# Patient Record
Sex: Male | Born: 1965 | Race: Black or African American | Hispanic: No | Marital: Married | Smoking: Never smoker
Health system: Southern US, Community
[De-identification: ages and names within clinical notes are randomized; demographics above are authoritative.]

## PROBLEM LIST (undated history)

## (undated) DIAGNOSIS — I10 Essential (primary) hypertension: Secondary | ICD-10-CM

## (undated) DIAGNOSIS — C61 Malignant neoplasm of prostate: Secondary | ICD-10-CM

## (undated) HISTORY — PX: OTHER SURGICAL HISTORY: SHX169

---

## 2004-05-09 ENCOUNTER — Emergency Department (HOSPITAL_COMMUNITY): Admission: EM | Admit: 2004-05-09 | Discharge: 2004-05-09 | Payer: Self-pay | Admitting: Family Medicine

## 2009-02-25 ENCOUNTER — Emergency Department (HOSPITAL_COMMUNITY): Admission: EM | Admit: 2009-02-25 | Discharge: 2009-02-25 | Payer: Self-pay | Admitting: Emergency Medicine

## 2014-01-02 ENCOUNTER — Emergency Department (HOSPITAL_BASED_OUTPATIENT_CLINIC_OR_DEPARTMENT_OTHER): Payer: Self-pay

## 2014-01-02 ENCOUNTER — Emergency Department (HOSPITAL_BASED_OUTPATIENT_CLINIC_OR_DEPARTMENT_OTHER)
Admission: EM | Admit: 2014-01-02 | Discharge: 2014-01-02 | Disposition: A | Discharge status: Home / Self Care | Payer: Self-pay | Attending: Emergency Medicine | Admitting: Emergency Medicine

## 2014-01-02 ENCOUNTER — Encounter (HOSPITAL_BASED_OUTPATIENT_CLINIC_OR_DEPARTMENT_OTHER): Payer: Self-pay | Admitting: Emergency Medicine

## 2014-01-02 DIAGNOSIS — Z792 Long term (current) use of antibiotics: Secondary | ICD-10-CM | POA: Insufficient documentation

## 2014-01-02 DIAGNOSIS — J209 Acute bronchitis, unspecified: Secondary | ICD-10-CM | POA: Insufficient documentation

## 2014-01-02 DIAGNOSIS — R059 Cough, unspecified: Secondary | ICD-10-CM

## 2014-01-02 DIAGNOSIS — M791 Myalgia: Secondary | ICD-10-CM | POA: Insufficient documentation

## 2014-01-02 DIAGNOSIS — J40 Bronchitis, not specified as acute or chronic: Secondary | ICD-10-CM

## 2014-01-02 DIAGNOSIS — R05 Cough: Secondary | ICD-10-CM

## 2014-01-02 MED ORDER — AZITHROMYCIN 250 MG PO TABS: 250.0000 mg | ORAL_TABLET | Freq: Every day | ORAL | Status: DC

## 2014-01-02 MED ORDER — ALBUTEROL SULFATE HFA 108 (90 BASE) MCG/ACT IN AERS
1.0000 | INHALATION_SPRAY | RESPIRATORY_TRACT | Status: DC | PRN
  Administered 2014-01-02: 2 via RESPIRATORY_TRACT
  Filled 2014-01-02: qty 6.7

## 2014-01-02 NOTE — ED Notes (Signed)
States he has had a productive cough since Oct 2014

## 2014-01-02 NOTE — ED Notes (Signed)
Pt presents to ED with complaints of fever and flu like symptoms for the past week.

## 2014-01-02 NOTE — Discharge Instructions (Signed)
Upper Respiratory Infection, Adult An upper respiratory infection (URI) is also sometimes known as the common cold. The upper respiratory tract includes the nose, sinuses, throat, trachea, and bronchi. Bronchi are the airways leading to the lungs. Most people improve within 1 week, but symptoms can last up to 2 weeks. A residual cough may last even longer.  CAUSES Many different viruses can infect the tissues lining the upper respiratory tract. The tissues become irritated and inflamed and often become very moist. Mucus production is also common. A cold is contagious. You can easily spread the virus to others by oral contact. This includes kissing, sharing a glass, coughing, or sneezing. Touching your mouth or nose and then touching a surface, which is then touched by another person, can also spread the virus. SYMPTOMS  Symptoms typically develop 1 to 3 days after you come in contact with a cold virus. Symptoms vary from person to person. They may include:  Runny nose.  Sneezing.  Nasal congestion.  Sinus irritation.  Sore throat.  Loss of voice (laryngitis).  Cough.  Fatigue.  Muscle aches.  Loss of appetite.  Headache.  Low-grade fever. DIAGNOSIS  You might diagnose your own cold based on familiar symptoms, since most people get a cold 2 to 3 times a year. Your caregiver can confirm this based on your exam. Most importantly, your caregiver can check that your symptoms are not due to another disease such as strep throat, sinusitis, pneumonia, asthma, or epiglottitis. Blood tests, throat tests, and X-rays are not necessary to diagnose a common cold, but they may sometimes be helpful in excluding other more serious diseases. Your caregiver will decide if any further tests are required. RISKS AND COMPLICATIONS  You may be at risk for a more severe case of the common cold if you smoke cigarettes, have chronic heart disease (such as heart failure) or lung disease (such as asthma), or if  you have a weakened immune system. The very young and very old are also at risk for more serious infections. Bacterial sinusitis, middle ear infections, and bacterial pneumonia can complicate the common cold. The common cold can worsen asthma and chronic obstructive pulmonary disease (COPD). Sometimes, these complications can require emergency medical care and may be life-threatening. PREVENTION  The best way to protect against getting a cold is to practice good hygiene. Avoid oral or hand contact with people with cold symptoms. Wash your hands often if contact occurs. There is no clear evidence that vitamin C, vitamin E, echinacea, or exercise reduces the chance of developing a cold. However, it is always recommended to get plenty of rest and practice good nutrition. TREATMENT  Treatment is directed at relieving symptoms. There is no cure. Antibiotics are not effective, because the infection is caused by a virus, not by bacteria. Treatment may include:  Increased fluid intake. Sports drinks offer valuable electrolytes, sugars, and fluids.  Breathing heated mist or steam (vaporizer or shower).  Eating chicken soup or other clear broths, and maintaining good nutrition.  Getting plenty of rest.  Using gargles or lozenges for comfort.  Controlling fevers with ibuprofen or acetaminophen as directed by your caregiver.  Increasing usage of your inhaler if you have asthma. Zinc gel and zinc lozenges, taken in the first 24 hours of the common cold, can shorten the duration and lessen the severity of symptoms. Pain medicines may help with fever, muscle aches, and throat pain. A variety of non-prescription medicines are available to treat congestion and runny nose. Your caregiver   can make recommendations and may suggest nasal or lung inhalers for other symptoms.  HOME CARE INSTRUCTIONS   Only take over-the-counter or prescription medicines for pain, discomfort, or fever as directed by your  caregiver.  Use a warm mist humidifier or inhale steam from a shower to increase air moisture. This may keep secretions moist and make it easier to breathe.  Drink enough water and fluids to keep your urine clear or pale yellow.  Rest as needed.  Return to work when your temperature has returned to normal or as your caregiver advises. You may need to stay home longer to avoid infecting others. You can also use a face mask and careful hand washing to prevent spread of the virus. SEEK MEDICAL CARE IF:   After the first few days, you feel you are getting worse rather than better.  You need your caregiver's advice about medicines to control symptoms.  You develop chills, worsening shortness of breath, or brown or red sputum. These may be signs of pneumonia.  You develop yellow or brown nasal discharge or pain in the face, especially when you bend forward. These may be signs of sinusitis.  You develop a fever, swollen neck glands, pain with swallowing, or white areas in the back of your throat. These may be signs of strep throat. SEEK IMMEDIATE MEDICAL CARE IF:   You have a fever.  You develop severe or persistent headache, ear pain, sinus pain, or chest pain.  You develop wheezing, a prolonged cough, cough up blood, or have a change in your usual mucus (if you have chronic lung disease).  You develop sore muscles or a stiff neck. Document Released: 07/18/2000 Document Revised: 04/16/2011 Document Reviewed: 04/29/2013 Exit[employer] Patient Information 2015 [employer]. This information is not intended to replace advice given to you by your health care provider. Make sure you discuss any questions you have with your health care provider.  

## 2014-01-02 NOTE — ED Provider Notes (Signed)
CSN: [id number]     Arrival date & time 01/02/14  1056 History   First MD Initiated Contact with Patient 01/02/14 1241     Chief Complaint  Patient presents with  . Fever     (Consider location/radiation/quality/duration/timing/severity/associated sxs/prior Treatment) HPI Comments: PT presents with cough and chest congestion.  He had symptoms about 2 weeks ago and go a little better, then started getting worse again about 3 days ago. He's continuing to have fevers with productive cough. He's having a little bit of wheezing but no shortness of breath. He's having myalgias. He has more chest congestion but not too much nasal congestion. He denies any nausea vomiting or diarrhea. He's a nonsmoker. He has a childhood history of asthma but hasn't had problems in many years. He's been using over-the-counter medicines without relief.  Patient is a 48 y.o. male presenting with fever.  Fever Associated symptoms: congestion, cough, myalgias and rhinorrhea   Associated symptoms: no chest pain, no chills, no diarrhea, no headaches, no nausea, no rash and no vomiting     History reviewed. No pertinent past medical history. History reviewed. No pertinent past surgical history. No family history on file. History  Substance Use Topics  . Smoking status: Never Smoker   . Smokeless tobacco: Not on file  . Alcohol Use: Not on file    Review of Systems  Constitutional: Positive for fever and fatigue. Negative for chills and diaphoresis.  HENT: Positive for congestion and rhinorrhea. Negative for sneezing.   Eyes: Negative.   Respiratory: Positive for cough and wheezing. Negative for chest tightness and shortness of breath.   Cardiovascular: Negative for chest pain and leg swelling.  Gastrointestinal: Negative for nausea, vomiting, abdominal pain, diarrhea and blood in stool.  Genitourinary: Negative for frequency, hematuria, flank pain and difficulty urinating.  Musculoskeletal: Positive for  myalgias. Negative for back pain and arthralgias.  Skin: Negative for rash.  Neurological: Negative for dizziness, speech difficulty, weakness, numbness and headaches.      Allergies  Review of patient's allergies indicates no known allergies.  Home Medications   Prior to Admission medications   Medication Sig Start Date End Date Taking? Authorizing Provider  azithromycin (ZITHROMAX) 250 MG tablet Take 1 tablet (250 mg total) by mouth daily. Take first 2 tablets together, then 1 every day until finished. 01/02/14   Malvin Johns, MD   BP 153/89 mmHg  Pulse 101  Temp(Src) 99.1 F (37.3 C) (Oral)  Resp 20  Ht 6\' 1"  (1.854 m)  Wt 245 lb (111.131 kg)  BMI 32.33 kg/m2  SpO2 100% Physical Exam  Constitutional: He is oriented to person, place, and time. He appears well-developed and well-nourished.  HENT:  Head: Normocephalic and atraumatic.  Right Ear: External ear normal.  Left Ear: External ear normal.  Mouth/Throat: Oropharynx is clear and moist.  Eyes: Pupils are equal, round, and reactive to light.  Neck: Normal range of motion. Neck supple.  Cardiovascular: Normal rate, regular rhythm and normal heart sounds.   Pulmonary/Chest: Effort normal. No respiratory distress. He has wheezes (mild expiratory wheezes bilaterally). He has no rales. He exhibits no tenderness.  Abdominal: Soft. Bowel sounds are normal. There is no tenderness. There is no rebound and no guarding.  Musculoskeletal: Normal range of motion. He exhibits no edema.  Lymphadenopathy:    He has no cervical adenopathy.  Neurological: He is alert and oriented to person, place, and time.  Skin: Skin is warm and dry. No rash noted.  Psychiatric: He  has a normal mood and affect.    ED Course  Procedures (including critical care time) Labs Review Labs Reviewed - No data to display  Imaging Review Dg Chest 2 View  01/02/2014   CLINICAL DATA:  Initial evaluation for congestion cough and fever for 1 week  EXAM:  CHEST  2 VIEW  COMPARISON:  None.  FINDINGS: The heart size and mediastinal contours are within normal limits. Both lungs are clear. The visualized skeletal structures are unremarkable.  IMPRESSION: No active cardiopulmonary disease.   Electronically Signed   By: Skipper Cliche M.D.   On: 01/02/2014 12:33     EKG Interpretation None      MDM   Final diagnoses:  Cough  Bronchitis    Given patient's ongoing symptoms, I will go ahead and start him on antibiotics as well as an albuterol inhaler. I encouraged him to follow-up with primary care physician or return here as needed for any worsening symptoms.    Malvin Johns, MD 01/02/14 1310

## 2015-04-25 ENCOUNTER — Encounter (HOSPITAL_BASED_OUTPATIENT_CLINIC_OR_DEPARTMENT_OTHER): Payer: Self-pay | Admitting: Emergency Medicine

## 2015-04-25 DIAGNOSIS — R05 Cough: Secondary | ICD-10-CM | POA: Diagnosis present

## 2015-04-25 DIAGNOSIS — J111 Influenza due to unidentified influenza virus with other respiratory manifestations: Secondary | ICD-10-CM | POA: Diagnosis not present

## 2015-04-25 NOTE — ED Notes (Signed)
Patient states that on saturday he started to have "flu like" symptoms. Reports that he is having a cough with lots of mucose and fever.  Patient reports generalized body aches and reports chest pain with a cough.

## 2015-04-26 ENCOUNTER — Emergency Department (HOSPITAL_BASED_OUTPATIENT_CLINIC_OR_DEPARTMENT_OTHER): Payer: BLUE CROSS/BLUE SHIELD

## 2015-04-26 ENCOUNTER — Emergency Department (HOSPITAL_BASED_OUTPATIENT_CLINIC_OR_DEPARTMENT_OTHER)
Admission: EM | Admit: 2015-04-26 | Discharge: 2015-04-26 | Disposition: A | Discharge status: Home / Self Care | Payer: BLUE CROSS/BLUE SHIELD | Attending: Emergency Medicine | Admitting: Emergency Medicine

## 2015-04-26 DIAGNOSIS — J111 Influenza due to unidentified influenza virus with other respiratory manifestations: Secondary | ICD-10-CM

## 2015-04-26 MED ORDER — ALBUTEROL SULFATE HFA 108 (90 BASE) MCG/ACT IN AERS
2.0000 | INHALATION_SPRAY | RESPIRATORY_TRACT | Status: DC | PRN
  Administered 2015-04-26: 2 via RESPIRATORY_TRACT
  Filled 2015-04-26: qty 6.7

## 2015-04-26 MED ORDER — HYDROCOD POLST-CPM POLST ER 10-8 MG/5ML PO SUER
5.0000 mL | Freq: Once | ORAL | Status: AC
  Administered 2015-04-26: 5 mL via ORAL
  Filled 2015-04-26: qty 5

## 2015-04-26 MED ORDER — DOXYCYCLINE HYCLATE 100 MG PO TABS
100.0000 mg | ORAL_TABLET | Freq: Once | ORAL | Status: AC
  Administered 2015-04-26: 100 mg via ORAL
  Filled 2015-04-26: qty 1

## 2015-04-26 MED ORDER — HYDROCOD POLST-CPM POLST ER 10-8 MG/5ML PO SUER: 5.0000 mL | Freq: Two times a day (BID) | ORAL | Status: DC | PRN

## 2015-04-26 MED ORDER — DOXYCYCLINE HYCLATE 100 MG PO CAPS: 100.0000 mg | ORAL_CAPSULE | Freq: Two times a day (BID) | ORAL | Status: DC

## 2015-04-26 NOTE — Discharge Instructions (Signed)

## 2015-04-26 NOTE — ED Provider Notes (Addendum)
CSN: [id number]     Arrival date & time 04/25/15  2350 History   First MD Initiated Contact with Patient 04/26/15 0131     Chief Complaint  Patient presents with  . Cough     (Consider location/radiation/quality/duration/timing/severity/associated sxs/prior Treatment) HPI  This is a 50 year old male with a three-day history of nasal congestion, fever, body aches, cough productive of green sputum, sore throat, chest soreness and malaise. He has taken DayQuil and Mucinex without adequate relief. He denies nausea, vomiting or diarrhea. Symptoms are moderate to severe.  History reviewed. No pertinent past medical history. History reviewed. No pertinent past surgical history. History reviewed. No pertinent family history. Social History  Substance Use Topics  . Smoking status: Never Smoker   . Smokeless tobacco: None  . Alcohol Use: No    Review of Systems  All other systems reviewed and are negative.   Allergies  Review of patient's allergies indicates no known allergies.  Home Medications   Prior to Admission medications   Medication Sig Start Date End Date Taking? Authorizing Provider  azithromycin (ZITHROMAX) 250 MG tablet Take 1 tablet (250 mg total) by mouth daily. Take first 2 tablets together, then 1 every day until finished. 01/02/14   Malvin Johns, MD   BP 171/93 mmHg  Pulse 104  Temp(Src) 99.1 F (37.3 C) (Oral)  Resp 20  Ht 6\' 1"  (1.854 m)  Wt 256 lb (116.121 kg)  BMI 33.78 kg/m2  SpO2 100%   Physical Exam  General: Well-developed, well-nourished male in no acute distress; appearance consistent with age of record HENT: normocephalic; atraumatic; nasal congestion; pharynx normal Eyes: pupils equal, round and reactive to light; extraocular muscles intact Neck: supple Heart: regular rate and rhythm Lungs: Decreased air movement bilaterally; frequent cough Abdomen: soft; nondistended; nontender; no masses or hepatosplenomegaly; bowel sounds  present Extremities: No deformity; full range of motion Neurologic: Awake, alert and oriented; motor function intact in all extremities and symmetric; no facial droop Skin: Warm and dry Psychiatric: Normal mood and affect    ED Course  Procedures (including critical care time)   MDM  Nursing notes and vitals signs, including pulse oximetry, reviewed.  Summary of this visit's results, reviewed by myself:  Imaging Studies: Dg Chest 2 View  04/26/2015  CLINICAL DATA:  Cough and fever for 3 days.  Previous smoker. EXAM: CHEST  2 VIEW COMPARISON:  01/02/2014 FINDINGS: Shallow inspiration with elevation of the left hemidiaphragm. Heart size and pulmonary vascularity are normal. Patchy infiltration demonstrated in the lung bases possibly representing pneumonia. No blunting of costophrenic angles. No pneumothorax. Mediastinal contours appear intact. Degenerative changes in the spine. IMPRESSION: Shallow inspiration. Patchy infiltrates in the lung bases may indicate pneumonia. Electronically Signed   By: Lucienne Capers M.D.   On: 04/26/2015 01:40   We'll treat for influenza as well as possible superimposed pneumonia.    Shanon Rosser, MD 04/26/15 [id number]  Shanon Rosser, MD 04/26/15 [id number]

## 2015-12-04 DIAGNOSIS — G8929 Other chronic pain: Secondary | ICD-10-CM | POA: Insufficient documentation

## 2015-12-04 DIAGNOSIS — R519 Headache, unspecified: Secondary | ICD-10-CM | POA: Insufficient documentation

## 2018-07-17 DIAGNOSIS — R972 Elevated prostate specific antigen [PSA]: Secondary | ICD-10-CM | POA: Insufficient documentation

## 2018-07-24 DIAGNOSIS — N5201 Erectile dysfunction due to arterial insufficiency: Secondary | ICD-10-CM | POA: Insufficient documentation

## 2019-05-18 ENCOUNTER — Other Ambulatory Visit: Payer: Self-pay

## 2019-05-19 ENCOUNTER — Ambulatory Visit (INDEPENDENT_AMBULATORY_CARE_PROVIDER_SITE_OTHER): Payer: 59 | Admitting: Medical

## 2019-05-19 ENCOUNTER — Telehealth: Payer: Self-pay | Admitting: Medical

## 2019-05-19 ENCOUNTER — Ambulatory Visit (HOSPITAL_BASED_OUTPATIENT_CLINIC_OR_DEPARTMENT_OTHER)
Admission: RE | Admit: 2019-05-19 | Discharge: 2019-05-19 | Disposition: A | Discharge status: Home / Self Care | Payer: 59 | Source: Ambulatory Visit | Attending: Medical | Admitting: Medical

## 2019-05-19 ENCOUNTER — Telehealth: Payer: Self-pay

## 2019-05-19 ENCOUNTER — Encounter: Payer: Self-pay | Admitting: Medical

## 2019-05-19 VITALS — BP 130/70 | HR 80 | Temp 97.0°F | Resp 18 | Ht 72.0 in | Wt 269.0 lb

## 2019-05-19 DIAGNOSIS — R350 Frequency of micturition: Secondary | ICD-10-CM | POA: Diagnosis not present

## 2019-05-19 DIAGNOSIS — M25562 Pain in left knee: Secondary | ICD-10-CM

## 2019-05-19 DIAGNOSIS — M2550 Pain in unspecified joint: Secondary | ICD-10-CM | POA: Diagnosis not present

## 2019-05-19 DIAGNOSIS — I10 Essential (primary) hypertension: Secondary | ICD-10-CM | POA: Diagnosis not present

## 2019-05-19 DIAGNOSIS — M79609 Pain in unspecified limb: Secondary | ICD-10-CM | POA: Diagnosis not present

## 2019-05-19 DIAGNOSIS — R972 Elevated prostate specific antigen [PSA]: Secondary | ICD-10-CM

## 2019-05-19 LAB — POC URINALSYSI DIPSTICK (AUTOMATED)
Bilirubin, UA: NEGATIVE
Blood, UA: 1
Glucose, UA: NEGATIVE
Ketones, UA: NEGATIVE
Leukocytes, UA: NEGATIVE
Nitrite, UA: NEGATIVE
Protein, UA: POSITIVE — AB
Spec Grav, UA: 1.02 (ref 1.010–1.025)
Urobilinogen, UA: 0.2 E.U./dL
pH, UA: 6.5 (ref 5.0–8.0)

## 2019-05-19 LAB — SEDIMENTATION RATE: Sed Rate: 46 mm/hr — ABNORMAL HIGH (ref 0–20)

## 2019-05-19 LAB — C-REACTIVE PROTEIN: CRP: <1 mg/dL (ref 0.5–20.0)

## 2019-05-19 LAB — URIC ACID: Uric Acid, Serum: 7 mg/dL (ref 4.0–7.8)

## 2019-05-19 LAB — PSA: PSA: 13.48 ng/mL — ABNORMAL HIGH (ref 0.10–4.00)

## 2019-05-19 MED ORDER — HYDROCHLOROTHIAZIDE 25 MG PO TABS: 25.0000 mg | ORAL_TABLET | Freq: Every day | ORAL | 11 refills | Status: DC

## 2019-05-19 MED ORDER — AMLODIPINE BESYLATE 10 MG PO TABS: 10.0000 mg | ORAL_TABLET | Freq: Every day | ORAL | 11 refills | Status: DC

## 2019-05-19 NOTE — Telephone Encounter (Signed)
Rx sent to pt pharmacy 

## 2019-05-19 NOTE — Progress Notes (Signed)
Subjective:    Patient ID: Malik Daniels, male    DOB: 01/28/1966, 54 y.o.   MRN: [id number]  HPI  Pt in for first time.  He is from first time. Pt was seeing [facility] provider previously. He came over due to insurance change.  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 Pt not exercising over past 2 months. Nonsmoker. Non alcohol use. 1-2 cups of coffee a day.  Pt states about 2 years ago his psa was 11.4. Pt states in past he had seen urologist and he had biopsy done last year. But on life insurance repeat his psa was elevated.    Pt has hx of htn. Pt bp little high initially when first came in. He does not check his bp at home. Recent blood work through [employer] came back negative except elevated psa.  Pt also states left knee pain with hx of intermittent swelling and also some tight hands. Knee pain since past Thursday. Was hurting to put pressure. Had anterior and back side popliteal area pain.   Review of Systems  Constitutional: Negative for chills, fatigue and fever.  Respiratory: Negative for cough, chest tightness, shortness of breath and wheezing.   Cardiovascular: Negative for chest pain and palpitations.  Gastrointestinal: Negative for abdominal pain, constipation and nausea.  Genitourinary: Positive for frequency. Negative for urgency.       Hx of elevated psa.  Sometimes weak stream and dribbles sometime.  Musculoskeletal: Positive for arthralgias. Negative for back pain.  Skin: Negative for rash.  Hematological: Negative for adenopathy. Does not bruise/bleed easily.  Psychiatric/Behavioral: Negative for behavioral problems, decreased concentration and suicidal ideas. The patient is not nervous/anxious and is not hyperactive.     No past medical history on file.   Social History   Socioeconomic History  . Marital status: Married    Spouse name: Not on file  . Number of children: Not on file  . Years of education: Not on file  . Highest education  level: Not on file  Occupational History  . Not on file  Tobacco Use  . Smoking status: Never Smoker  Substance and Sexual Activity  . Alcohol use: No  . Drug use: No  . Sexual activity: Not on file  Other Topics Concern  . Not on file  Social History Narrative  . Not on file   Social Determinants of Health   Financial Resource Strain:   . Difficulty of Paying Living Expenses:   Food Insecurity:   . Worried About Charity fundraiser in the Last Year:   . Arboriculturist in the Last Year:   Transportation Needs:   . Film/video editor (Medical):   Marland Kitchen Lack of Transportation (Non-Medical):   Physical Activity:   . Days of Exercise per Week:   . Minutes of Exercise per Session:   Stress:   . Feeling of Stress :   Social Connections:   . Frequency of Communication with Friends and Family:   . Frequency of Social Gatherings with Friends and Family:   . Attends Religious Services:   . Active Member of Clubs or Organizations:   . Attends Archivist Meetings:   Marland Kitchen Marital Status:   Intimate Partner Violence:   . Fear of Current or Ex-Partner:   . Emotionally Abused:   Marland Kitchen Physically Abused:   . Sexually Abused:     No past surgical history on file.  No family history on file.  No Known Allergies  Current Outpatient Medications on File Prior to Visit  Medication Sig Dispense Refill  . amLODipine (NORVASC) 10 MG tablet Take by mouth.    . hydrochlorothiazide (HYDRODIURIL) 25 MG tablet Take by mouth.    . chlorpheniramine-HYDROcodone (TUSSIONEX PENNKINETIC ER) 10-8 MG/5ML SUER Take 5 mLs by mouth every 12 (twelve) hours as needed. (Patient not taking: Reported on 05/19/2019) 70 mL 0  . doxycycline (VIBRAMYCIN) 100 MG capsule Take 1 capsule (100 mg total) by mouth 2 (two) times daily. One po bid x 7 days (Patient not taking: Reported on 05/19/2019) 14 capsule 0   No current facility-administered medications on file prior to visit.    BP (!) 146/87 (BP Location:  Left Arm, Patient Position: Sitting, Cuff Size: Large)   Pulse 80   Temp (!) 97 F (36.1 C) (Temporal)   Resp 18   Ht 6' (1.829 m)   Wt 269 lb (122 kg)   SpO2 98%   BMI 36.48 kg/m       Objective:   Physical Exam  General Mental Status- Alert. General Appearance- Not in acute distress.   Skin General: Color- Normal Color. Moisture- Normal Moisture.  Neck Carotid Arteries- Normal color. Moisture- Normal Moisture. No carotid bruits. No JVD.  Chest and Lung Exam Auscultation: Breath Sounds:-Normal.  Cardiovascular Auscultation:Rythm- Regular. Murmurs & Other Heart Sounds:Auscultation of the heart reveals- No Murmurs.  Abdomen Inspection:-Inspeection Normal. Palpation/Percussion:Note:No mass. Palpation and Percussion of the abdomen reveal- Non Tender, Non Distended + BS, no rebound or guarding.    Neurologic Cranial Nerve exam:- CN III-XII intact(No nystagmus), symmetric smile. Strength:- 5/5 equal and symmetric strength both upper and lower extremities.  Left knee pain- faint crepitus on range of motion. No instability. Left lower ext- no current homans signs. But faint bulge on palpation.(last week pain popliteal area on walking)      Assessment & Plan:  Your blood pressure is well controlled on her recheck today.  Continue current BP medication.  We recommend that you get blood pressure cuff over-the-counter and have available to check it at home on occasion.  You describe recent arthralgias in the left knee and bilateral hands.  Did place lab orders to get inflammatory lab panel.  For left knee pain recently, did go ahead and place left knee x-ray.  Want to assess joint space.  No prescription given for knee pain as you report pain did decrease compared to last week.  Also states she had some recent R popliteal pain do want to go ahead and get left lower extremity ultrasound to evaluate if you might have Baker's cyst versus DVT?  For history of elevated PSA, I  did add PSA to the labs today.  When I get that value back do have plans to go ahead and refer you back to urologist.  Also will go ahead and get urinalysis today and a urine culture since you report frequent urination.  Follow-up in 3 weeks or as needed.  Do asked that you try to print the copies of recent labs done through the life insurance company.  That way we can scanned into our chart.  Time spent with patient today was 30 minutes which consisted discussing history, diagnoses, work up,  Treatment plans  and documentation.

## 2019-05-19 NOTE — Telephone Encounter (Signed)
Returning a call in regards to lab results

## 2019-05-19 NOTE — Telephone Encounter (Signed)
See referral to urologist.

## 2019-05-19 NOTE — Telephone Encounter (Signed)
Patient states he didn't receive his refill on his BP medication during his OV .

## 2019-05-19 NOTE — Patient Instructions (Signed)
Your blood pressure is well controlled on her recheck today.  Continue current BP medication.  We recommend that you get blood pressure cuff over-the-counter and have available to check it at home on occasion.  You describe recent arthralgias in the left knee and bilateral hands.  Did place lab orders to get inflammatory lab panel.  For left knee pain recently, did go ahead and place left knee x-ray.  Want to assess joint space.  No prescription given for knee pain as you report pain did decrease compared to last week.  Also states she had some recent R popliteal pain do want to go ahead and get left lower extremity ultrasound to evaluate if you might have Baker's cyst versus DVT?  For history of elevated PSA, I did add PSA to the labs today.  When I get that value back do have plans to go ahead and refer you back to urologist.  Also will go ahead and get urinalysis today and a urine culture since you report frequent urination.  Follow-up in 3 weeks or as needed.  Do asked that you try to print the copies of recent labs done through the life insurance company.  That way we can scanned into our chart.

## 2019-05-19 NOTE — Telephone Encounter (Signed)
Refilled pt bp medications.

## 2019-05-21 ENCOUNTER — Telehealth: Payer: Self-pay | Admitting: Medical

## 2019-05-21 DIAGNOSIS — M2550 Pain in unspecified joint: Secondary | ICD-10-CM

## 2019-05-21 DIAGNOSIS — R768 Other specified abnormal immunological findings in serum: Secondary | ICD-10-CM

## 2019-05-21 LAB — URINE CULTURE
MICRO NUMBER:: 10357165
Result:: NO GROWTH
SPECIMEN QUALITY:: ADEQUATE

## 2019-05-21 LAB — ANTI-NUCLEAR AB-TITER (ANA TITER): ANA Titer 1: 1:80 {titer} — ABNORMAL HIGH

## 2019-05-21 LAB — ANA: Anti Nuclear Antibody (ANA): POSITIVE — AB

## 2019-05-21 LAB — RHEUMATOID FACTOR: Rhuematoid fact SerPl-aCnc: <14 IU/mL (ref <14)

## 2019-06-09 ENCOUNTER — Ambulatory Visit (INDEPENDENT_AMBULATORY_CARE_PROVIDER_SITE_OTHER): Payer: 59 | Admitting: Medical

## 2019-06-09 ENCOUNTER — Other Ambulatory Visit: Payer: Self-pay

## 2019-06-09 ENCOUNTER — Encounter: Payer: Self-pay | Admitting: Medical

## 2019-06-09 VITALS — BP 136/70 | HR 66 | Temp 97.0°F | Resp 16 | Ht 72.0 in | Wt 263.2 lb

## 2019-06-09 DIAGNOSIS — M2550 Pain in unspecified joint: Secondary | ICD-10-CM

## 2019-06-09 DIAGNOSIS — M25562 Pain in left knee: Secondary | ICD-10-CM

## 2019-06-09 DIAGNOSIS — R768 Other specified abnormal immunological findings in serum: Secondary | ICD-10-CM | POA: Diagnosis not present

## 2019-06-09 DIAGNOSIS — I10 Essential (primary) hypertension: Secondary | ICD-10-CM

## 2019-06-09 DIAGNOSIS — R972 Elevated prostate specific antigen [PSA]: Secondary | ICD-10-CM | POA: Diagnosis not present

## 2019-06-09 LAB — COMPREHENSIVE METABOLIC PANEL
ALT: 26 U/L (ref 0–53)
AST: 26 U/L (ref 0–37)
Albumin: 4.4 g/dL (ref 3.5–5.2)
Alkaline Phosphatase: 52 U/L (ref 39–117)
BUN: 10 mg/dL (ref 6–23)
CO2: 29 mEq/L (ref 19–32)
Calcium: 9.9 mg/dL (ref 8.4–10.5)
Chloride: 101 mEq/L (ref 96–112)
Creatinine, Ser: 1.03 mg/dL (ref 0.40–1.50)
GFR: 91.17 mL/min (ref >60.00)
Glucose, Bld: 98 mg/dL (ref 70–99)
Potassium: 3.6 mEq/L (ref 3.5–5.1)
Sodium: 137 mEq/L (ref 135–145)
Total Bilirubin: 0.8 mg/dL (ref 0.2–1.2)
Total Protein: 7.4 g/dL (ref 6.0–8.3)

## 2019-06-09 LAB — LIPID PANEL
Cholesterol: 163 mg/dL (ref 0–200)
HDL: 44.2 mg/dL (ref >39.00)
LDL Cholesterol: 101 mg/dL — ABNORMAL HIGH (ref 0–99)
NonHDL: 118.77
Total CHOL/HDL Ratio: 4
Triglycerides: 87 mg/dL (ref 0.0–149.0)
VLDL: 17.4 mg/dL (ref 0.0–40.0)

## 2019-06-09 NOTE — Progress Notes (Signed)
Subjective:    Patient ID: Malik Daniels, male    DOB: November 25, 1965, 54 y.o.   MRN: [id number]  HPI  Pt in for follow up. He has not been checking bp cuff but did get bp cuff yesterday. Pt on amlodipine and hctz.  Pt has arthralgia hx. Elevated sed rate and + Ana. He has rheumatologist appointment in July.  Pt psa was elevated 3 weeks ago. Pt has seen urologist last week. Pt being followed and recent urologist did review bx. Will follow up in 4 months.  Pt had osteoarthritis type changes found on his left knee.  No DVT found in [place].      Review of Systems  Constitutional: Negative for chills, fatigue and fever.  Respiratory: Negative for cough, chest tightness, shortness of breath and wheezing.   Cardiovascular: Negative for chest pain and palpitations.  Gastrointestinal: Negative for abdominal pain, nausea and vomiting.  Genitourinary: Negative for dysuria.  Musculoskeletal: Positive for arthralgias.  Skin: Negative for rash.  Neurological: Negative for dizziness, speech difficulty, weakness, light-headedness and headaches.  Hematological: Negative for adenopathy. Does not bruise/bleed easily.  Psychiatric/Behavioral: Negative for behavioral problems, dysphoric mood and sleep disturbance. The patient is not nervous/anxious.     No past medical history on file.   Social History   Socioeconomic History  . Marital status: Married    Spouse name: Not on file  . Number of children: Not on file  . Years of education: Not on file  . Highest education level: Not on file  Occupational History  . Not on file  Tobacco Use  . Smoking status: Never Smoker  . Smokeless tobacco: Never Used  Substance and Sexual Activity  . Alcohol use: No  . Drug use: No  . Sexual activity: Not on file  Other Topics Concern  . Not on file  Social History Narrative  . Not on file   Social Determinants of Health   Financial Resource Strain:   . Difficulty of Paying Living Expenses:   Food  Insecurity:   . Worried About Charity fundraiser in the Last Year:   . Arboriculturist in the Last Year:   Transportation Needs:   . Film/video editor (Medical):   Marland Kitchen Lack of Transportation (Non-Medical):   Physical Activity:   . Days of Exercise per Week:   . Minutes of Exercise per Session:   Stress:   . Feeling of Stress :   Social Connections:   . Frequency of Communication with Friends and Family:   . Frequency of Social Gatherings with Friends and Family:   . Attends Religious Services:   . Active Member of Clubs or Organizations:   . Attends Archivist Meetings:   Marland Kitchen Marital Status:   Intimate Partner Violence:   . Fear of Current or Ex-Partner:   . Emotionally Abused:   Marland Kitchen Physically Abused:   . Sexually Abused:     No past surgical history on file.  No family history on file.  No Known Allergies  Current Outpatient Medications on File Prior to Visit  Medication Sig Dispense Refill  . amLODipine (NORVASC) 10 MG tablet Take 1 tablet (10 mg total) by mouth daily. 30 tablet 11  . chlorpheniramine-HYDROcodone (TUSSIONEX PENNKINETIC ER) 10-8 MG/5ML SUER Take 5 mLs by mouth every 12 (twelve) hours as needed. (Patient not taking: Reported on 05/19/2019) 70 mL 0  . doxycycline (VIBRAMYCIN) 100 MG capsule Take 1 capsule (100 mg total) by mouth  2 (two) times daily. One po bid x 7 days (Patient not taking: Reported on 05/19/2019) 14 capsule 0  . hydrochlorothiazide (HYDRODIURIL) 25 MG tablet Take 1 tablet (25 mg total) by mouth daily. 30 tablet 11   No current facility-administered medications on file prior to visit.    BP (!) 141/73 (BP Location: Left Arm, Patient Position: Sitting, Cuff Size: Large)   Pulse 66   Temp (!) 97 F (36.1 C) (Temporal)   Resp 16   Ht 6' (1.829 m)   Wt 263 lb 3.2 oz (119.4 kg)   SpO2 99%   BMI 35.70 kg/m       Objective:   Physical Exam  General- No acute distress. Pleasant patient. Neck- Full range of motion, no  jvd Lungs- Clear, even and unlabored. Heart- regular rate and rhythm. Neurologic- CNII- XII grossly intact.  Left knee- mild crepitus on flexion and extension.       Assessment & Plan:  Your bp is in good range today. Continue current meds and recommend check bp at home when relaxed. Hoping to get consistent 130/80 or less.   For increased sed rate, + ANA and joint pains referral to rheumatologist pending.  For left knee pain can use tylenol. If worsen let me know and can refer to sports med.   For elevated psa follow up with urologist.  Follow up date to be determined after lab review.   Mackie Pai, PA-C   Time spent with patient today was  20 minutes which consisted of chart review, discussing diagnosis,  Treatment, potential referrals and documentation.

## 2019-06-09 NOTE — Patient Instructions (Addendum)
Your bp is in good range today. Continue current meds and recommend check bp at home when relaxed. Hoping to get consistent 130/80 or less.   For increased sed rate, + ANA and joint pains referral to rheumatologist pending.  For left knee pain can use tylenol. If worsen let me know and can refer to sports med.   For elevated psa follow up with urologist.   Follow up date to be determined after lab review.

## 2019-08-19 NOTE — Progress Notes (Deleted)
° °  Office Visit Note  Patient: Malik Daniels             Date of Birth: Jan 15, 1966           MRN: [id number]             PCP: Malik Daniels Referring: Malik Daniels Visit Date: 09/01/2019 Occupation: @GUAROCC @  Subjective:  No chief complaint on file.   History of Present Illness: Malik Daniels is a 54 y.o. male ***   Activities of Daily Living:  Patient reports morning stiffness for *** {minute/hour:19697}.   Patient {ACTIONS;DENIES/REPORTS:21021675::"Denies"} nocturnal pain.  Difficulty dressing/grooming: {ACTIONS;DENIES/REPORTS:21021675::"Denies"} Difficulty climbing stairs: {ACTIONS;DENIES/REPORTS:21021675::"Denies"} Difficulty getting out of chair: {ACTIONS;DENIES/REPORTS:21021675::"Denies"} Difficulty using hands for taps, buttons, cutlery, and/or writing: {ACTIONS;DENIES/REPORTS:21021675::"Denies"}  No Rheumatology ROS completed.   PMFS History:  Patient Active Problem List   Diagnosis Date Noted   Essential hypertension 05/19/2019   Arthralgia 05/19/2019   Popliteal pain 05/19/2019    No past medical history on file.  No family history on file. No past surgical history on file. Social History   Social History Narrative   Not on file    There is no immunization history on file for this patient.   Objective: Vital Signs: There were no vitals taken for this visit.   Physical Exam   Musculoskeletal Exam: ***  CDAI Exam: CDAI Score: -- Patient Global: --; Provider Global: -- Swollen: --; Tender: -- Joint Exam 09/01/2019   No joint exam has been documented for this visit   There is currently no information documented on the homunculus. Go to the Rheumatology activity and complete the homunculus joint exam.  Investigation: No additional findings.  Imaging: No results found.  Recent Labs: Lab Results  Component Value Date   NA 137 06/09/2019   K 3.6 06/09/2019   CL 101 06/09/2019   CO2 29 06/09/2019   GLUCOSE 98 06/09/2019    BUN 10 06/09/2019   CREATININE 1.03 06/09/2019   BILITOT 0.8 06/09/2019   ALKPHOS 52 06/09/2019   AST 26 06/09/2019   ALT 26 06/09/2019   PROT 7.4 06/09/2019   ALBUMIN 4.4 06/09/2019   CALCIUM 9.9 06/09/2019    Speciality Comments: No specialty comments available.  Procedures:  No procedures performed Allergies: Patient has no known allergies.   Assessment / Plan:     Visit Diagnoses: No diagnosis found.  Orders: No orders of the defined types were placed in this encounter.  No orders of the defined types were placed in this encounter.   Face-to-face time spent with patient was *** minutes. Greater than 50% of time was spent in counseling and coordination of care.  Follow-Up Instructions: No follow-ups on file.   Ofilia Neas, PA-C  Note - This record has been created using Dragon software.  Chart creation errors have been sought, but may not always  have been located. Such creation errors do not reflect on  the standard of medical care.

## 2019-09-01 ENCOUNTER — Ambulatory Visit: Payer: 59 | Admitting: Rheumatology

## 2019-09-23 ENCOUNTER — Ambulatory Visit: Payer: 59 | Admitting: Rheumatology

## 2019-10-20 ENCOUNTER — Other Ambulatory Visit: Payer: Self-pay | Admitting: Urology

## 2019-10-20 DIAGNOSIS — R972 Elevated prostate specific antigen [PSA]: Secondary | ICD-10-CM

## 2019-11-19 ENCOUNTER — Other Ambulatory Visit: Payer: Self-pay

## 2019-11-19 ENCOUNTER — Ambulatory Visit
Admission: RE | Admit: 2019-11-19 | Discharge: 2019-11-19 | Disposition: A | Discharge status: Home / Self Care | Payer: 59 | Source: Ambulatory Visit | Attending: Urology | Admitting: Urology

## 2019-11-19 DIAGNOSIS — R972 Elevated prostate specific antigen [PSA]: Secondary | ICD-10-CM

## 2019-11-19 MED ORDER — GADOBENATE DIMEGLUMINE 529 MG/ML IV SOLN
20.0000 mL | Freq: Once | INTRAVENOUS | Status: AC | PRN
  Administered 2019-11-19: 20 mL via INTRAVENOUS

## 2020-05-27 ENCOUNTER — Other Ambulatory Visit: Payer: Self-pay | Admitting: Medical

## 2020-06-23 IMAGING — US US EXTREM LOW VENOUS*L*
2 series · 13 of 24 positions shown · non-contrast
Comparison: None.

CLINICAL DATA: Left lower extremity pain.  Evaluate for DVT.



[Series 1: us extrem low venous*left* · 12 of 32 slices shown (1 of 2)]
[im 1/32]
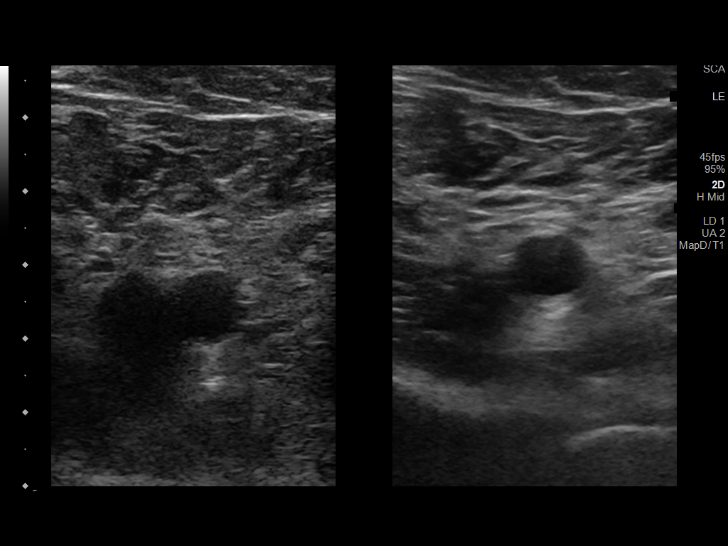
[im 3/32]
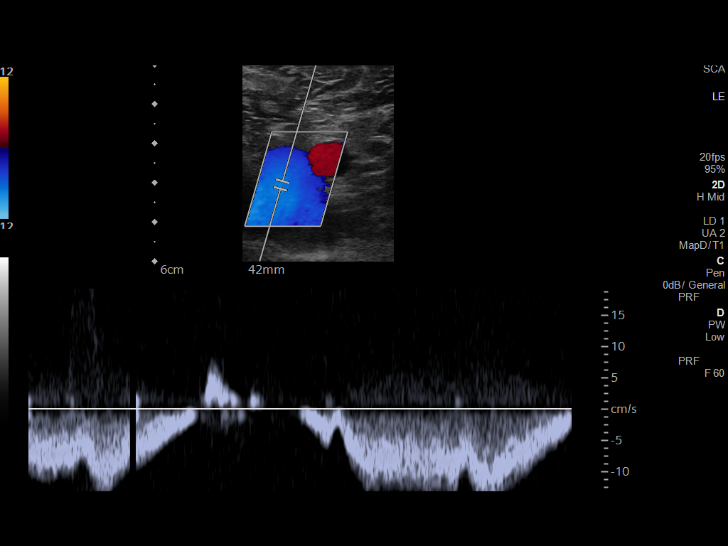
[im 6/32]
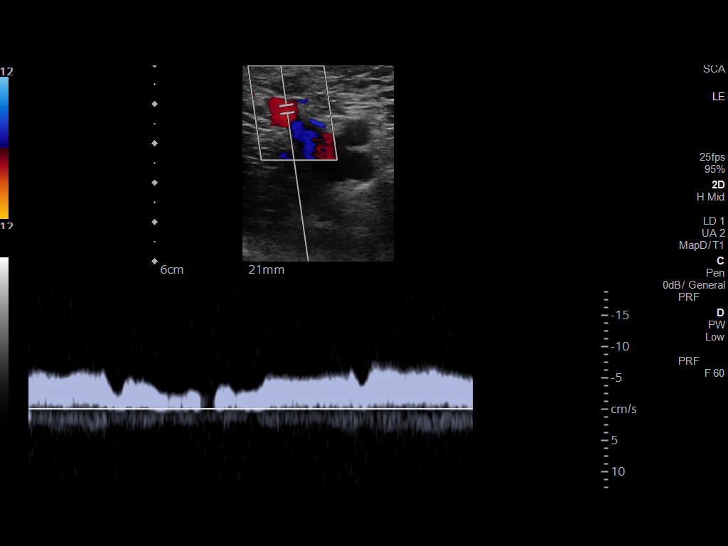
[im 9/32]
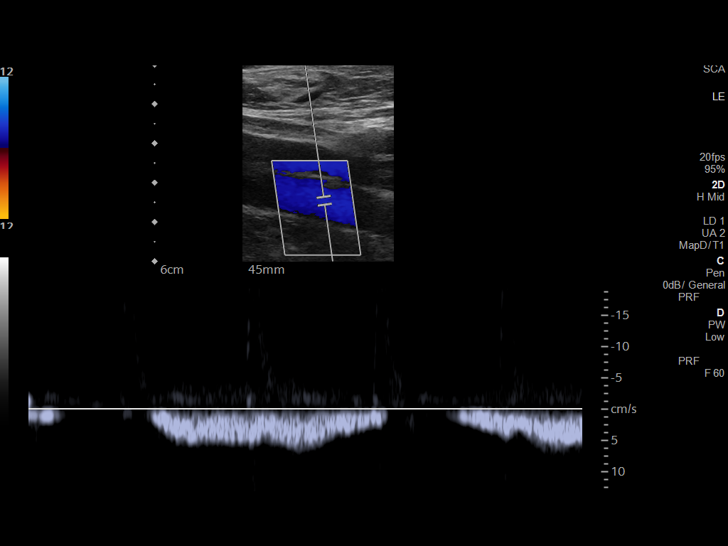
[im 12/32]
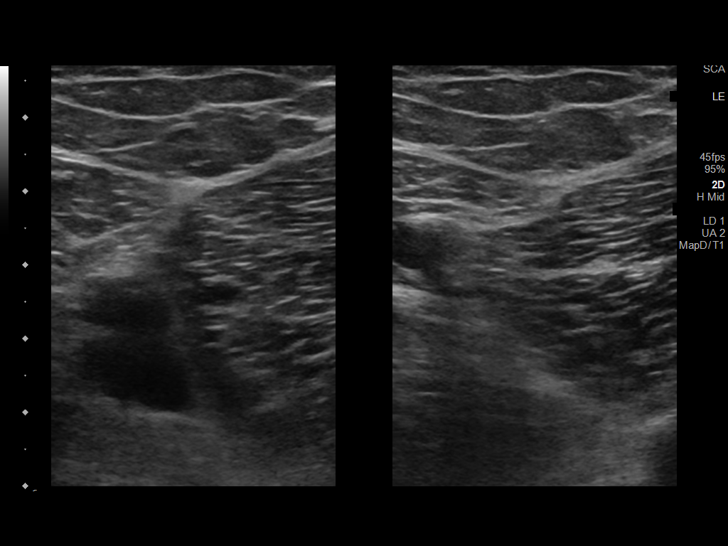
[im 15/32]
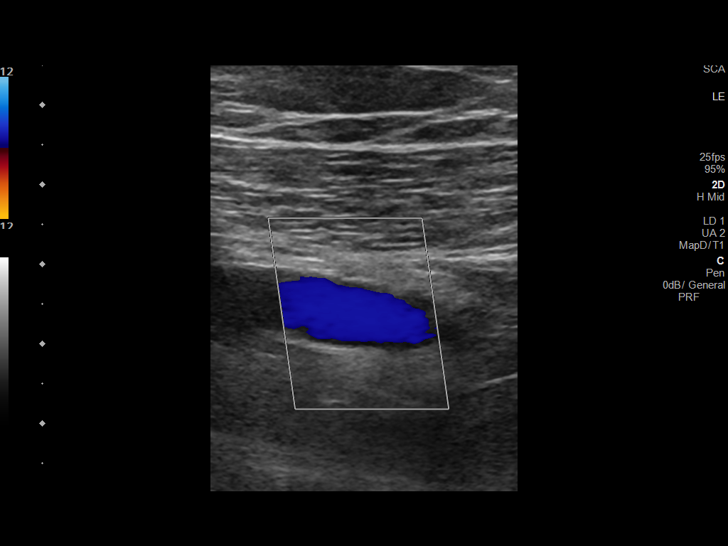
[im 18/32]
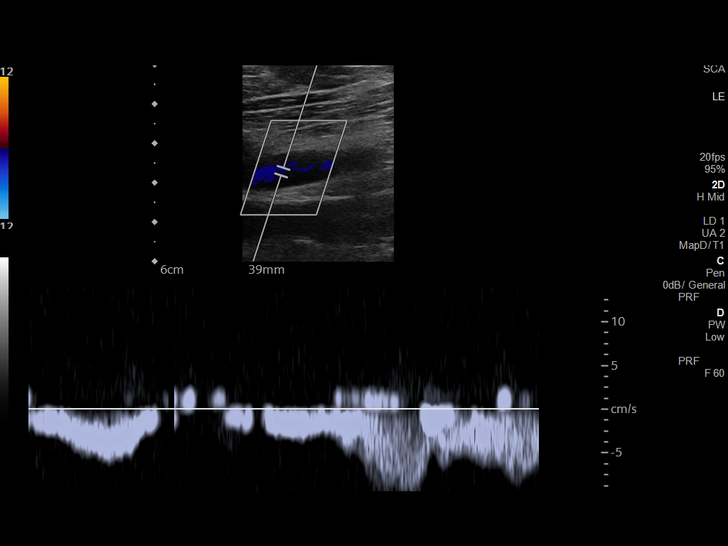
[im 20/32]
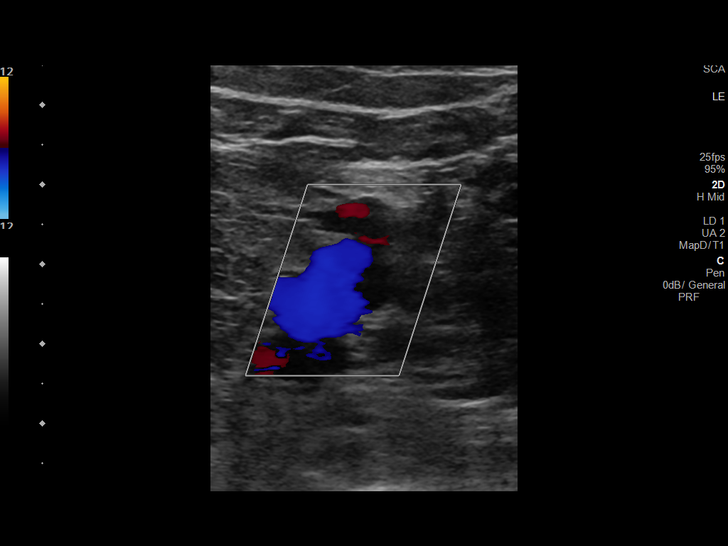
[im 23/32]
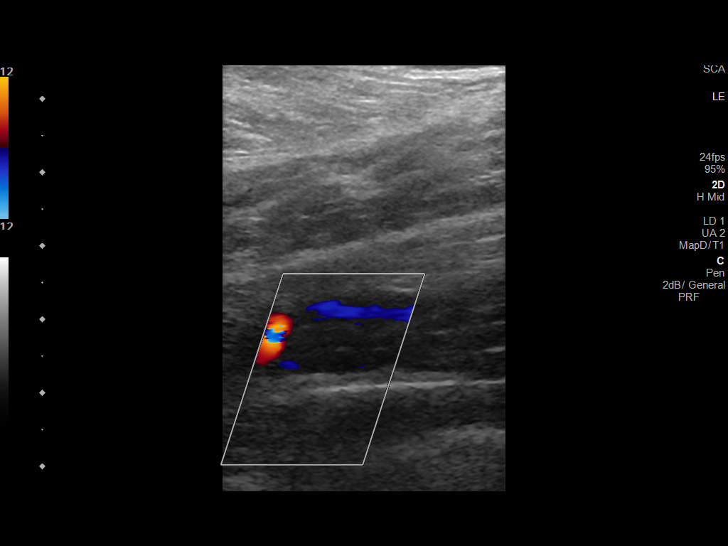
[im 26/32]
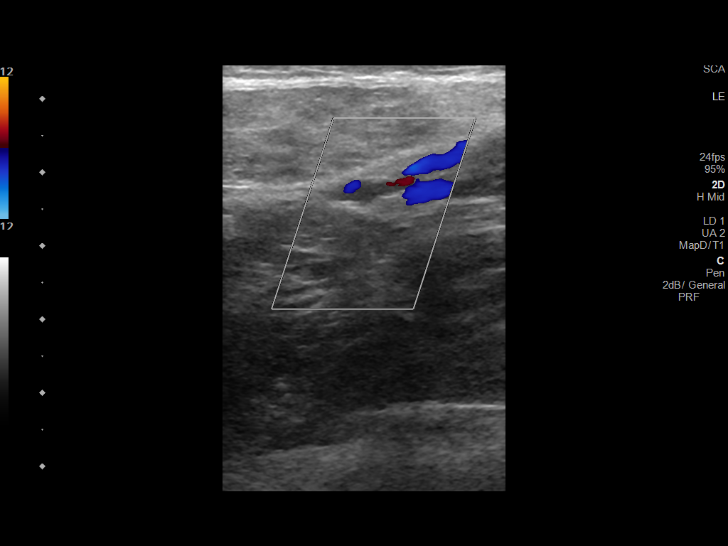
[im 29/32]
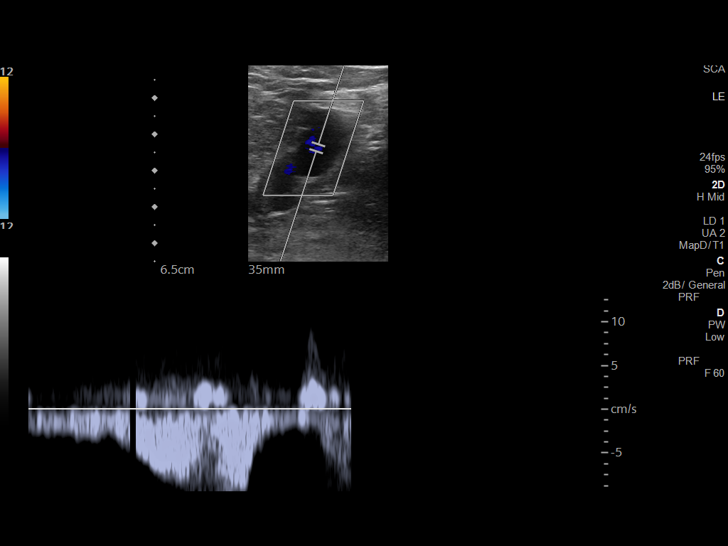
[im 32/32]
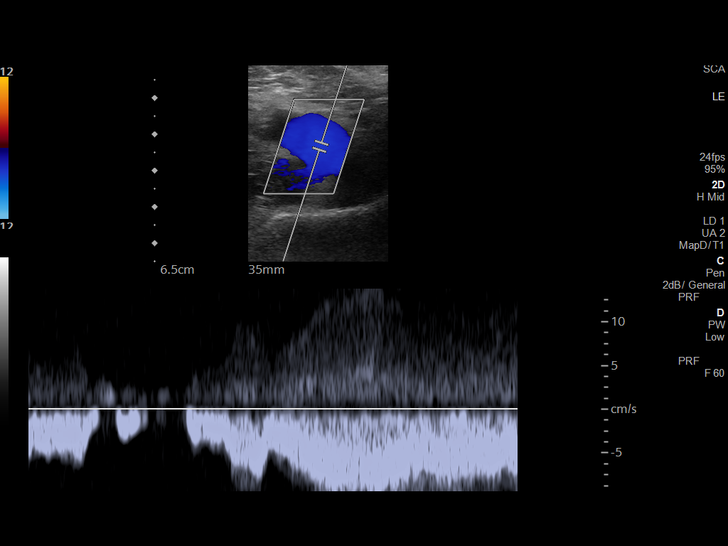

[Series 2: us extrem low venous*left* · 1 of 3 slices shown (2 of 2)]
[im 3/3]
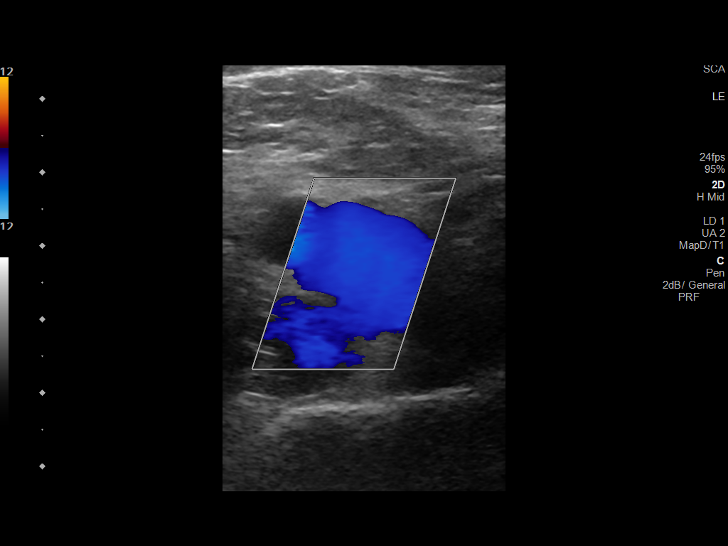

[13 of 24 positions shown; findings below may reference images not displayed]

FINDINGS: Contralateral Common Femoral Vein: Respiratory phasicity is normal
and symmetric with the symptomatic side. No evidence of thrombus.
Normal compressibility.

Common Femoral Vein: No evidence of thrombus. Normal
compressibility, respiratory phasicity and response to augmentation.

Saphenofemoral Junction: No evidence of thrombus. Normal
compressibility and flow on color Doppler imaging.

Profunda Femoral Vein: No evidence of thrombus. Normal
compressibility and flow on color Doppler imaging.

Femoral Vein: No evidence of thrombus. Normal compressibility,
respiratory phasicity and response to augmentation.

Popliteal Vein: No evidence of thrombus. Normal compressibility,
respiratory phasicity and response to augmentation.

Calf Veins: No evidence of thrombus. Normal compressibility and flow
on color Doppler imaging.

Superficial Great Saphenous Vein: No evidence of thrombus. Normal
compressibility.

Venous Reflux:  None.

Other Findings:  None.
IMPRESSION: No evidence of DVT within the left lower extremity.

## 2020-07-27 ENCOUNTER — Ambulatory Visit (INDEPENDENT_AMBULATORY_CARE_PROVIDER_SITE_OTHER): Payer: 59 | Admitting: Medical

## 2020-07-27 ENCOUNTER — Other Ambulatory Visit: Payer: Self-pay

## 2020-07-27 VITALS — BP 130/75 | HR 69 | Temp 98.5°F | Resp 18 | Ht 72.0 in | Wt 260.8 lb

## 2020-07-27 DIAGNOSIS — R55 Syncope and collapse: Secondary | ICD-10-CM

## 2020-07-27 DIAGNOSIS — I10 Essential (primary) hypertension: Secondary | ICD-10-CM

## 2020-07-27 DIAGNOSIS — M2550 Pain in unspecified joint: Secondary | ICD-10-CM

## 2020-07-27 DIAGNOSIS — R9431 Abnormal electrocardiogram [ECG] [EKG]: Secondary | ICD-10-CM

## 2020-07-27 LAB — CBC WITH DIFFERENTIAL/PLATELET
Basophils Absolute: 0.1 10*3/uL (ref 0.0–0.1)
Basophils Relative: 0.6 % (ref 0.0–3.0)
Eosinophils Absolute: 0.2 10*3/uL (ref 0.0–0.7)
Eosinophils Relative: 2 % (ref 0.0–5.0)
HCT: 42.7 % (ref 39.0–52.0)
Hemoglobin: 14.4 g/dL (ref 13.0–17.0)
Lymphocytes Relative: 30.6 % (ref 12.0–46.0)
Lymphs Abs: 2.5 10*3/uL (ref 0.7–4.0)
MCHC: 33.7 g/dL (ref 30.0–36.0)
MCV: 95.1 fl (ref 78.0–100.0)
Monocytes Absolute: 0.7 10*3/uL (ref 0.1–1.0)
Monocytes Relative: 8.6 % (ref 3.0–12.0)
Neutro Abs: 4.7 10*3/uL (ref 1.4–7.7)
Neutrophils Relative %: 58.2 % (ref 43.0–77.0)
Platelets: 240 10*3/uL (ref 150.0–400.0)
RBC: 4.49 Mil/uL (ref 4.22–5.81)
RDW: 13.6 % (ref 11.5–15.5)
WBC: 8.2 10*3/uL (ref 4.0–10.5)

## 2020-07-27 LAB — COMPREHENSIVE METABOLIC PANEL
ALT: 37 U/L (ref 0–53)
AST: 28 U/L (ref 0–37)
Albumin: 4.4 g/dL (ref 3.5–5.2)
Alkaline Phosphatase: 47 U/L (ref 39–117)
BUN: 16 mg/dL (ref 6–23)
CO2: 27 mEq/L (ref 19–32)
Calcium: 9.7 mg/dL (ref 8.4–10.5)
Chloride: 101 mEq/L (ref 96–112)
Creatinine, Ser: 1.05 mg/dL (ref 0.40–1.50)
GFR: 80.3 mL/min (ref >60.00)
Glucose, Bld: 83 mg/dL (ref 70–99)
Potassium: 3.6 mEq/L (ref 3.5–5.1)
Sodium: 138 mEq/L (ref 135–145)
Total Bilirubin: 0.4 mg/dL (ref 0.2–1.2)
Total Protein: 7.2 g/dL (ref 6.0–8.3)

## 2020-07-27 NOTE — Patient Instructions (Addendum)
History of syncope x1 event.  Circumstances the day of syncope indicates probable vasovagal syncope.  Day of event donated plasma and felt some dizziness and nausea while donation taking place.  Then later upset stomach that proceeded syncopal episode.  We did EKG today, CBC, CMP and place referral to cardiologist and neurologist.  This would rule out more severe causes of syncope.  Ekg showd sinus rhythm with rbbb.(Incomplete) No prior ekg to compare.  Make sure that you stay well-hydrated during the summer before doing home projects and sweating a lot.  Recommend hydrating with propel fitness water or sugar-free Gatorade.  Avoid any sugary bed  If you have any recurrent syncopal event prior to appointment with specialist then recommend ED evaluation.   Blood pressure is well controlled on recheck today.  Continue amlodipine and chlorthalidone.  History of arthralgias and you missed rheumatologist appointment.  Placed another rheumatology referral.  Follow-up in 2 to 3 weeks or as needed.  Syncope Syncope is when you pass out (faint) for a short time. It is caused by a sudden decrease in blood flow to the brain. Signs that you may be about to pass out include: Feeling dizzy or light-headed. Feeling sick to your stomach (nauseous). Seeing all white or all black. Having cold, clammy skin. If you pass out, get help right away. Call your local emergency services (911 in the [place] Do not drive yourself to the hospital. Follow these instructions at home: Watch for any changes in your symptoms. Take these actions to stay safe andhelp with your symptoms: Lifestyle Do not drive, use machinery, or play sports until your doctor says it is okay. Do not drink alcohol. Do not use any products that contain nicotine or tobacco, such as cigarettes and e-cigarettes. If you need help quitting, ask your doctor. Drink enough fluid to keep your pee (urine) pale yellow. General instructions Take  over-the-counter and prescription medicines only as told by your doctor. If you are taking blood pressure or heart medicine, sit up and stand up slowly. Spend a few minutes getting ready to sit and then stand. This can help you feel less dizzy. Have someone stay with you until you feel stable. If you start to feel like you might pass out, lie down right away and raise (elevate) your feet above the level of your heart. Breathe deeply and steadily. Wait until all of the symptoms are gone. Keep all follow-up visits as told by your doctor. This is important. Get help right away if: You have a very bad headache. You pass out once or more than once. You have pain in your chest, belly, or back. You have a very fast or uneven heartbeat (palpitations). It hurts to breathe. You are bleeding from your mouth or your bottom (rectum). You have black or tarry poop (stool). You have jerky movements that you cannot control (seizure). You are confused. You have trouble walking. You are very weak. You have vision problems. These symptoms may be an emergency. Do not wait to see if the symptoms will go away. Get medical help right away. Call your local emergency services (911 in the [place] Do not drive yourself to the hospital. Summary Syncope is when you pass out (faint) for a short time. It is caused by a sudden decrease in blood flow to the brain. Signs that you may be about to faint include feeling dizzy, light-headed, or sick to your stomach, seeing all white or all black, or having cold, clammy skin. If you  start to feel like you might pass out, lie down right away and raise (elevate) your feet above the level of your heart. Breathe deeply and steadily. Wait until all of the symptoms are gone. This information is not intended to replace advice given to you by your health care provider. Make sure you discuss any questions you have with your healthcare provider. Document Revised: 03/04/2019 Document Reviewed:  03/06/2017 Elsevier Patient Education  2022 [employer].

## 2020-07-27 NOTE — Progress Notes (Signed)
Subjective:    Patient ID: Malik Daniels, male    DOB: 1965/03/25, 55 y.o.   MRN: [id number]  HPI  Pt has hx of htn. Bp mild high initially. Pt is on amlodipine 10 mg daily and hctz 25 mg daily.   Pt states recently passed out on Saturday. Pt states he donated plasma. While donating he felt dizziness. They stopped donation due to dizziness. He states dizziness was unusual. He went home. He had upset stomach and passed out walking to bathroom. Fell on carpet. Pt wife saw him fall. No seizure movement described. Pt may have had loc 30 seconds or so.    No preceding chest symptoms. No gross motor or sensory function deficits. EMS came out. Did ekg, vitals and offered ED visit. Pt declines. Pulse, blood sugar all normal per pt.  Pt states rested the rest of the day. No recurrent syncope.  No hx of seizures. No hx of syncope.  Pt feels fine presently.      Review of Systems  Constitutional:  Negative for chills, fatigue and fever.  HENT:  Negative for congestion and drooling.   Respiratory:  Negative for cough, chest tightness, shortness of breath and wheezing.   Cardiovascular:  Negative for chest pain and palpitations.  Gastrointestinal:  Negative for abdominal pain, diarrhea and nausea.        Hpi. Brief upset stomach.  Genitourinary:  Negative for dysuria, flank pain and frequency.  Musculoskeletal:  Negative for back pain and neck pain.  Skin:  Positive for rash.  Neurological:  Positive for syncope. Negative for dizziness, weakness, light-headedness and headaches.       See hpi.  No post ictal type ha described.  Hematological:  Negative for adenopathy. Does not bruise/bleed easily.  Psychiatric/Behavioral:  Negative for behavioral problems and confusion.     No past medical history on file.   Social History   Socioeconomic History   Marital status: Married    Spouse name: Not on file   Number of children: Not on file   Years of education: Not on file   Highest  education level: Not on file  Occupational History   Not on file  Tobacco Use   Smoking status: Never   Smokeless tobacco: Never  Substance and Sexual Activity   Alcohol use: No   Drug use: No   Sexual activity: Not on file  Other Topics Concern   Not on file  Social History Narrative   Not on file   Social Determinants of Health   Financial Resource Strain: Not on file  Food Insecurity: Not on file  Transportation Needs: Not on file  Physical Activity: Not on file  Stress: Not on file  Social Connections: Not on file  Intimate Partner Violence: Not on file    No past surgical history on file.  No family history on file.  No Known Allergies  Current Outpatient Medications on File Prior to Visit  Medication Sig Dispense Refill   amLODipine (NORVASC) 10 MG tablet TAKE 1 TABLET(10 MG) BY MOUTH DAILY 30 tablet 11   hydrochlorothiazide (HYDRODIURIL) 25 MG tablet TAKE 1 TABLET(25 MG) BY MOUTH DAILY 30 tablet 11   No current facility-administered medications on file prior to visit.    BP (!) 150/75   Pulse 69   Temp 98.5 F (36.9 C)   Resp 18   Ht 6' (1.829 m)   Wt 260 lb 12.8 oz (118.3 kg)   SpO2 99%   BMI  35.37 kg/m       Objective:   Physical Exam  General Mental Status- Alert. General Appearance- Not in acute distress.   Skin General: Color- Normal Color. Moisture- Normal Moisture.  Neck Carotid Arteries- Normal color. Moisture- Normal Moisture. No carotid bruits. No JVD.  Chest and Lung Exam Auscultation: Breath Sounds:-Normal.  Cardiovascular Auscultation:Rythm- Regular. Murmurs & Other Heart Sounds:Auscultation of the heart reveals- No Murmurs.  Abdomen Inspection:-Inspeection Normal. Palpation/Percussion:Note:No mass. Palpation and Percussion of the abdomen reveal- Non Tender, Non Distended + BS, no rebound or guarding.    Neurologic Cranial Nerve exam:- CN III-XII intact(No nystagmus), symmetric smile. Drift Test:- No drift. Romberg  Exam:- Negative.  Heal to Toe Gait exam:-Normal. Finger to Nose:- Normal/Intact Strength:- 5/5 equal and symmetric strength both upper and lower extremities.       Assessment & Plan:  History of syncope x1 event.  Circumstances the day of syncope indicates probable vasovagal syncope.  Day of event donated plasma and felt some dizziness and nausea while donation taking place.  Then later upset stomach that proceeded syncopal episode.  We did EKG today, CBC, CMP and place referral to cardiologist and neurologist.  This would rule out more severe causes of syncope.  Make sure that you stay well-hydrated during the summer before doing home projects and sweating a lot.  Recommend hydrating with [employer]el fitness water or sugar-free Gatorade.  Avoid any sugary bed  If you have any recurrent syncopal event prior to appointment with specialist then recommend ED evaluation.   Blood pressure is well controlled on recheck today.  Continue amlodipine and chlorthalidone.  History of arthralgias and you missed rheumatologist appointment.  Placed another rheumatology referral.  Follow-up in 2 to 3 weeks or as needed.  Mackie Pai, PA-C   Time spent with patient today was  41 minutes which consisted of chart review, discussing diagnoses, work up , treatment and documentation.

## 2020-09-07 ENCOUNTER — Ambulatory Visit (INDEPENDENT_AMBULATORY_CARE_PROVIDER_SITE_OTHER): Payer: 59 | Admitting: Cardiology

## 2020-09-07 ENCOUNTER — Other Ambulatory Visit: Payer: Self-pay

## 2020-09-07 ENCOUNTER — Ambulatory Visit (INDEPENDENT_AMBULATORY_CARE_PROVIDER_SITE_OTHER): Payer: 59

## 2020-09-07 ENCOUNTER — Encounter: Payer: Self-pay | Admitting: Cardiology

## 2020-09-07 VITALS — BP 147/77 | HR 83 | Ht 72.0 in | Wt 263.1 lb

## 2020-09-07 DIAGNOSIS — R42 Dizziness and giddiness: Secondary | ICD-10-CM | POA: Diagnosis not present

## 2020-09-07 DIAGNOSIS — R600 Localized edema: Secondary | ICD-10-CM | POA: Diagnosis not present

## 2020-09-07 DIAGNOSIS — E8881 Metabolic syndrome: Secondary | ICD-10-CM | POA: Diagnosis not present

## 2020-09-07 DIAGNOSIS — R55 Syncope and collapse: Secondary | ICD-10-CM | POA: Diagnosis not present

## 2020-09-07 DIAGNOSIS — I10 Essential (primary) hypertension: Secondary | ICD-10-CM

## 2020-09-07 NOTE — Progress Notes (Signed)
Cardiology Office Note:    Date:  09/08/2020   ID:  Malik Daniels, DOB Feb 10, 1965, MRN [id number]  PCP:  Mackie Pai, PA-C  Cardiologist:  Berniece Salines, DO  Electrophysiologist:  None   Referring MD: Mackie Pai, PA-C   History of Present Illness:    Malik Daniels is a 55 y.o. male with a hx of HTN who presents for evaluation of syncope.  Patient had a syncopal episode several weeks ago. Reports he donated blood that morning. Has donated blood many times in the past without issue. Also had a few episodes of diarrhea that day. Felt dizzy when he got home. He stood up to use the bathroom and "passed out". States it was very brief and his wife saw it happen. He immediately heard her voice and there was no period of confusion afterward. No abnormal movements noted.  Endorses dizzy spells approximately once per week. Typically occur with exertion. Described as a pre-syncopal sensation but if he braces himself and rests, the dizziness resolves. No associated palpitations, diaphoresis, chest pain or dyspnea.   History reviewed. No pertinent past medical history.  Past Surgical History:  Procedure Laterality Date   NONE      Current Medications: Current Meds  Medication Sig   amLODipine (NORVASC) 10 MG tablet TAKE 1 TABLET(10 MG) BY MOUTH DAILY   hydrochlorothiazide (HYDRODIURIL) 25 MG tablet TAKE 1 TABLET(25 MG) BY MOUTH DAILY     Allergies:   Patient has no known allergies.   Social History   Socioeconomic History   Marital status: Married    Spouse name: Not on file   Number of children: Not on file   Years of education: Not on file   Highest education level: Not on file  Occupational History   Not on file  Tobacco Use   Smoking status: Never   Smokeless tobacco: Never  Substance and Sexual Activity   Alcohol use: No   Drug use: No   Sexual activity: Not on file  Other Topics Concern   Not on file  Social History Narrative   Not on file   Social  Determinants of Health   Financial Resource Strain: Not on file  Food Insecurity: Not on file  Transportation Needs: Not on file  Physical Activity: Not on file  Stress: Not on file  Social Connections: Not on file     Family History: The patient's family history includes Aneurysm in his mother; Congestive Heart Failure in his brother and brother; Diabetes in his brother; Heart Problems in his sister; Hypertension in his mother; Kidney disease in his brother.  ROS:   Review of Systems  Constitution: Negative for decreased appetite, fever and weight gain.  HENT: Negative for congestion, ear discharge, hoarse voice and sore throat.   Eyes: Negative for discharge, redness, vision loss in right eye and visual halos.  Cardiovascular: Negative for chest pain, dyspnea on exertion, leg swelling, orthopnea and palpitations.  Respiratory: Negative for cough, hemoptysis, shortness of breath and snoring.   Endocrine: Negative for heat intolerance and polyphagia.  Hematologic/Lymphatic: Negative for bleeding problem. Does not bruise/bleed easily.  Skin: Negative for flushing, nail changes, rash and suspicious lesions.  Musculoskeletal: Negative for arthritis, joint pain, muscle cramps, myalgias, neck pain and stiffness.  Gastrointestinal: Negative for abdominal pain, bowel incontinence, diarrhea and excessive appetite.  Genitourinary: Negative for decreased libido, genital sores and incomplete emptying.  Neurological: Negative for brief paralysis, focal weakness, headaches and loss of balance.  Psychiatric/Behavioral: Negative for  altered mental status, depression and suicidal ideas.  Allergic/Immunologic: Negative for HIV exposure and persistent infections.    EKGs/Labs/Other Studies Reviewed:    The following studies were reviewed today:   EKG:  The ekg ordered today demonstrates normal sinus rhythm at 83bpm, normal axis, normal intervals, no ST-T changes  Recent Labs: 07/27/2020: ALT 37;  BUN 16; Creatinine, Ser 1.05; Hemoglobin 14.4; Platelets 240.0; Potassium 3.6; Sodium 138  Recent Lipid Panel    Component Value Date/Time   CHOL 163 06/09/2019 0904   TRIG 87.0 06/09/2019 0904   HDL 44.20 06/09/2019 0904   CHOLHDL 4 06/09/2019 0904   VLDL 17.4 06/09/2019 0904   LDLCALC 101 (H) 06/09/2019 0904    Physical Exam:    VS:  BP (!) 147/77 (BP Location: Right Arm, Patient Position: Sitting, Cuff Size: Large)   Pulse 83   Ht 6' (1.829 m)   Wt 263 lb 1.9 oz (119.4 kg)   SpO2 98%   BMI 35.69 kg/m     Wt Readings from Last 3 Encounters:  09/07/20 263 lb 1.9 oz (119.4 kg)  07/27/20 260 lb 12.8 oz (118.3 kg)  06/09/19 263 lb 3.2 oz (119.4 kg)     GEN: Well nourished, well developed in no acute distress HEENT: Normal NECK: No JVD; No carotid bruits CARDIAC: S1S2 noted, RRR, no murmurs, rubs, gallops RESPIRATORY:  Clear to auscultation without rales, wheezing or rhonchi  EXTREMITIES: 1+ pitting edema of bilateral lower extremities, No cyanosis, no clubbing MUSCULOSKELETAL:  No deformity  SKIN: Warm and dry NEUROLOGIC:  Alert and oriented x 3, non-focal PSYCHIATRIC:  Normal affect, good insight  ASSESSMENT:    1. Metabolic syndrome   2. Dizziness   3. Syncope and collapse   4. Bilateral leg edema   5. Hypertension, unspecified type    PLAN:     Syncope: history most consistent with orthostatic hypotension or vasovagal incident related to blood donation and diarrhea that morning. However, given his recurrent episodes of dizziness, will send Zio monitor for 7 days to further evaluate for any arrhythmia or conduction abnormality. Will also obtain echocardiogram. HTN: blood pressure elevated above goal today. He was advised to check blood pressure regularly at home and contact our office or his PCP if readings are persistently above 130/90. Counseled on importance of reducing salt intake. Continue current medications (amlodipine '10mg'$  and HCTZ '25mg'$ ) Metabolic Syndrome:  check A1c for further cardiac risk stratification.  The patient is in agreement with the above plan. The patient left the office in stable condition.  The patient will follow up in 12 weeks.   Medication Adjustments/Labs and Tests Ordered: Current medicines are reviewed at length with the patient today.  Concerns regarding medicines are outlined above.  Orders Placed This Encounter  Procedures   Hemoglobin A1c   LONG TERM MONITOR (3-14 DAYS)   EKG 12-Lead   ECHOCARDIOGRAM COMPLETE   No orders of the defined types were placed in this encounter.   Patient Instructions  Medication Instructions:  Your physician recommends that you continue on your current medications as directed. Please refer to the Current Medication list given to you today.  *If you need a refill on your cardiac medications before your next appointment, please call your pharmacy*   Lab Work: Your physician recommends that you return for lab work in:  TODAY: HbgA1C If you have labs (blood work) drawn today and your tests are completely normal, you will receive your results only by: [place] (if you have [place])  OR A paper copy in the mail If you have any lab test that is abnormal or we need to change your treatment, we will call you to review the results.   Testing/Procedures: Your physician has requested that you have an echocardiogram. Echocardiography is a painless test that uses sound waves to create images of your heart. It provides your doctor with information about the size and shape of your heart and how well your heart's chambers and valves are working. This procedure takes approximately one hour. There are no restrictions for this procedure.  A zio monitor was ordered today. It will remain on for 7 days. You will then return monitor and event diary in provided box. It takes 1-2 weeks for report to be downloaded and returned to [place]. We will call you with the results. If monitor falls off or has orange  flashing light, please call Zio for further instructions.     Follow-Up: At [facility], you and your health needs are our priority.  As part of our continuing mission to provide you with exceptional heart care, we have created designated Provider Care Teams.  These Care Teams include your primary Cardiologist (physician) and Advanced Practice Providers (APPs -  Physician Assistants and Nurse Practitioners) who all work together to provide you with the care you need, when you need it.  We recommend signing up for the patient portal called "MyChart".  Sign up information is provided on this After Visit Summary.  MyChart is used to connect with patients for Virtual Visits (Telemedicine).  Patients are able to view lab/test results, encounter notes, upcoming appointments, etc.  Non-urgent messages can be sent to your provider as well.   To learn more about what you can do with MyChart, go to [email or web address].    Your next appointment:   12 week(s)  The format for your next appointment:   In Person  Provider:   [address] - Kardie Tobb, DO    Other Instructions ZIO  WHY IS MY DOCTOR PRESCRIBING ZIO? The Zio system is proven and trusted by physicians to detect and diagnose irregular heart rhythms -- and has been prescribed to hundreds of thousands of patients.  The FDA has cleared the Zio system to monitor for many different kinds of irregular heart rhythms. In a study, physicians were able to reach a diagnosis 90% of the time with the Zio system1.  You can wear the Zio monitor -- a small, discreet, comfortable patch -- during your normal day-to-day activity, including while you sleep, shower, and exercise, while it records every single heartbeat for analysis.  1Barrett, P., et al. Comparison of 24 Hour Holter Monitoring Versus 14 Day Novel Adhesive Patch Electrocardiographic Monitoring. [place], 2014.  ZIO VS. HOLTER MONITORING The Zio monitor can be  comfortably worn for up to 14 days. Holter monitors can be worn for 24 to 48 hours, limiting the time to record any irregular heart rhythms you may have. Zio is able to capture data for the 51% of patients who have their first symptom-triggered arrhythmia after 48 hours.1  LIVE WITHOUT RESTRICTIONS The Zio ambulatory cardiac monitor is a small, unobtrusive, and water-resistant patch--you might even forget you're wearing it. The Zio monitor records and stores every beat of your heart, whether you're sleeping, working out, or showering. Remove 7 days after application.   Echocardiogram An echocardiogram is a test that uses sound waves (ultrasound) to produce images of the heart. Images from an echocardiogram can provide important information  about: Heart size and shape. The size and thickness and movement of your heart's walls. Heart muscle function and strength. Heart valve function or if you have stenosis. Stenosis is when the heart valves are too narrow. If blood is flowing backward through the heart valves (regurgitation). A tumor or infectious growth around the heart valves. Areas of heart muscle that are not working well because of poor blood flow or injury from a heart attack. Aneurysm detection. An aneurysm is a weak or damaged part of an artery wall. The wall bulges out from the normal force of blood pumping through the body. Tell a health care provider about: Any allergies you have. All medicines you are taking, including vitamins, herbs, eye drops, creams, and over-the-counter medicines. Any blood disorders you have. Any surgeries you have had. Any medical conditions you have. Whether you are pregnant or may be pregnant. What are the risks? Generally, this is a safe test. However, problems may occur, including an allergic reaction to dye (contrast) that may be used during the test. What happens before the test? No specific preparation is needed. You may eat and drink  normally. What happens during the test?  You will take off your clothes from the waist up and put on a hospital gown. Electrodes or electrocardiogram (ECG)patches may be placed on your chest. The electrodes or patches are then connected to a device that monitors your heart rate and rhythm. You will lie down on a table for an ultrasound exam. A gel will be applied to your chest to help sound waves pass through your skin. A handheld device, called a transducer, will be pressed against your chest and moved over your heart. The transducer produces sound waves that travel to your heart and bounce back (or "echo" back) to the transducer. These sound waves will be captured in real-time and changed into images of your heart that can be viewed on a video monitor. The images will be recorded on a computer and reviewed by your health care provider. You may be asked to change positions or hold your breath for a short time. This makes it easier to get different views or better views of your heart. In some cases, you may receive contrast through an IV in one of your veins. This can improve the quality of the pictures from your heart. The procedure may vary among health care providers and hospitals. What can I expect after the test? You may return to your normal, everyday life, including diet, activities, andmedicines, unless your health care provider tells you not to do that. Follow these instructions at home: It is up to you to get the results of your test. Ask your health care provider, or the department that is doing the test, when your results will be ready. Keep all follow-up visits. This is important. Summary An echocardiogram is a test that uses sound waves (ultrasound) to produce images of the heart. Images from an echocardiogram can provide important information about the size and shape of your heart, heart muscle function, heart valve function, and other possible heart problems. You do not need to do  anything to prepare before this test. You may eat and drink normally. After the echocardiogram is completed, you may return to your normal, everyday life, unless your health care provider tells you not to do that. This information is not intended to replace advice given to you by your health care provider. Make sure you discuss any questions you have with your healthcare provider. Document  Revised: 09/15/2019 Document Reviewed: 09/15/2019 Elsevier Patient Education  2022 [place].    Adopting a Healthy Lifestyle.  Know what a healthy weight is for you (roughly BMI <25) and aim to maintain this   Aim for 7+ servings of fruits and vegetables daily   65-80+ fluid ounces of water or unsweet tea for healthy kidneys   Limit to max 1 drink of alcohol per day; avoid smoking/tobacco   Limit animal fats in diet for cholesterol and heart health - choose grass fed whenever available   Avoid highly processed foods, and foods high in saturated/trans fats   Aim for low stress - take time to unwind and care for your mental health   Aim for 150 min of moderate intensity exercise weekly for heart health, and weights twice weekly for bone health   Aim for 7-9 hours of sleep daily   When it comes to diets, agreement about the perfect plan isnt easy to find, even among the experts. Experts at the [place] [employer] developed an idea known as the Healthy Eating Plate. Just imagine a plate divided into logical, healthy portions.   The emphasis is on diet quality:   Load up on vegetables and fruits - one-half of your plate: Aim for color and variety, and remember that potatoes dont count.   Go for whole grains - one-quarter of your plate: Whole wheat, barley, wheat berries, quinoa, oats, brown rice, and foods made with them. If you want pasta, go with whole wheat pasta.   Protein power - one-quarter of your plate: Fish, chicken, beans, and nuts are all healthy, versatile protein  sources. Limit red meat.   The diet, however, does go beyond the plate, offering a few other suggestions.   Use healthy plant oils, such as olive, canola, soy, corn, sunflower and peanut. Check the labels, and avoid partially hydrogenated oil, which have unhealthy trans fats.   If youre thirsty, drink water. Coffee and tea are good in moderation, but skip sugary drinks and limit milk and dairy products to one or two daily servings.   The type of carbohydrate in the diet is more important than the amount. Some sources of carbohydrates, such as vegetables, fruits, whole grains, and beans-are healthier than others.   Finally, stay active  Signed, Berniece Salines, DO  09/08/2020 8:16 AM    [place]

## 2020-09-07 NOTE — Patient Instructions (Signed)
Medication Instructions:  Your physician recommends that you continue on your current medications as directed. Please refer to the Current Medication list given to you today.  *If you need a refill on your cardiac medications before your next appointment, please call your pharmacy*   Lab Work: Your physician recommends that you return for lab work in:  TODAY: HbgA1C If you have labs (blood work) drawn today and your tests are completely normal, you will receive your results only by: [place] (if you have [place]) OR A paper copy in the mail If you have any lab test that is abnormal or we need to change your treatment, we will call you to review the results.   Testing/Procedures: Your physician has requested that you have an echocardiogram. Echocardiography is a painless test that uses sound waves to create images of your heart. It provides your doctor with information about the size and shape of your heart and how well your heart's chambers and valves are working. This procedure takes approximately one hour. There are no restrictions for this procedure.  A zio monitor was ordered today. It will remain on for 7 days. You will then return monitor and event diary in provided box. It takes 1-2 weeks for report to be downloaded and returned to [place]. We will call you with the results. If monitor falls off or has orange flashing light, please call Zio for further instructions.     Follow-Up: At [facility], you and your health needs are our priority.  As part of our continuing mission to provide you with exceptional heart care, we have created designated Provider Care Teams.  These Care Teams include your primary Cardiologist (physician) and Advanced Practice Providers (APPs -  Physician Assistants and Nurse Practitioners) who all work together to provide you with the care you need, when you need it.  We recommend signing up for the patient portal called "MyChart".  Sign up information is  provided on this After Visit Summary.  MyChart is used to connect with patients for Virtual Visits (Telemedicine).  Patients are able to view lab/test results, encounter notes, upcoming appointments, etc.  Non-urgent messages can be sent to your provider as well.   To learn more about what you can do with MyChart, go to [email or web address].    Your next appointment:   12 week(s)  The format for your next appointment:   In Person  Provider:   [address] - Kardie Tobb, DO    Other Instructions ZIO  WHY IS MY DOCTOR PRESCRIBING ZIO? The Zio system is proven and trusted by physicians to detect and diagnose irregular heart rhythms -- and has been prescribed to hundreds of thousands of patients.  The FDA has cleared the Zio system to monitor for many different kinds of irregular heart rhythms. In a study, physicians were able to reach a diagnosis 90% of the time with the Zio system1.  You can wear the Zio monitor -- a small, discreet, comfortable patch -- during your normal day-to-day activity, including while you sleep, shower, and exercise, while it records every single heartbeat for analysis.  1Barrett, P., et al. Comparison of 24 Hour Holter Monitoring Versus 14 Day Novel Adhesive Patch Electrocardiographic Monitoring. [place], 2014.  ZIO VS. HOLTER MONITORING The Zio monitor can be comfortably worn for up to 14 days. Holter monitors can be worn for 24 to 48 hours, limiting the time to record any irregular heart rhythms you may have. Zio is able to capture  data for the 51% of patients who have their first symptom-triggered arrhythmia after 48 hours.1  LIVE WITHOUT RESTRICTIONS The Zio ambulatory cardiac monitor is a small, unobtrusive, and water-resistant patch--you might even forget you're wearing it. The Zio monitor records and stores every beat of your heart, whether you're sleeping, working out, or showering. Remove 7 days after application.    Echocardiogram An echocardiogram is a test that uses sound waves (ultrasound) to produce images of the heart. Images from an echocardiogram can provide important information about: Heart size and shape. The size and thickness and movement of your heart's walls. Heart muscle function and strength. Heart valve function or if you have stenosis. Stenosis is when the heart valves are too narrow. If blood is flowing backward through the heart valves (regurgitation). A tumor or infectious growth around the heart valves. Areas of heart muscle that are not working well because of poor blood flow or injury from a heart attack. Aneurysm detection. An aneurysm is a weak or damaged part of an artery wall. The wall bulges out from the normal force of blood pumping through the body. Tell a health care provider about: Any allergies you have. All medicines you are taking, including vitamins, herbs, eye drops, creams, and over-the-counter medicines. Any blood disorders you have. Any surgeries you have had. Any medical conditions you have. Whether you are pregnant or may be pregnant. What are the risks? Generally, this is a safe test. However, problems may occur, including an allergic reaction to dye (contrast) that may be used during the test. What happens before the test? No specific preparation is needed. You may eat and drink normally. What happens during the test?  You will take off your clothes from the waist up and put on a hospital gown. Electrodes or electrocardiogram (ECG)patches may be placed on your chest. The electrodes or patches are then connected to a device that monitors your heart rate and rhythm. You will lie down on a table for an ultrasound exam. A gel will be applied to your chest to help sound waves pass through your skin. A handheld device, called a transducer, will be pressed against your chest and moved over your heart. The transducer produces sound waves that travel to your  heart and bounce back (or "echo" back) to the transducer. These sound waves will be captured in real-time and changed into images of your heart that can be viewed on a video monitor. The images will be recorded on a computer and reviewed by your health care provider. You may be asked to change positions or hold your breath for a short time. This makes it easier to get different views or better views of your heart. In some cases, you may receive contrast through an IV in one of your veins. This can improve the quality of the pictures from your heart. The procedure may vary among health care providers and hospitals. What can I expect after the test? You may return to your normal, everyday life, including diet, activities, andmedicines, unless your health care provider tells you not to do that. Follow these instructions at home: It is up to you to get the results of your test. Ask your health care provider, or the department that is doing the test, when your results will be ready. Keep all follow-up visits. This is important. Summary An echocardiogram is a test that uses sound waves (ultrasound) to produce images of the heart. Images from an echocardiogram can provide important information about the size  and shape of your heart, heart muscle function, heart valve function, and other possible heart problems. You do not need to do anything to prepare before this test. You may eat and drink normally. After the echocardiogram is completed, you may return to your normal, everyday life, unless your health care provider tells you not to do that. This information is not intended to replace advice given to you by your health care provider. Make sure you discuss any questions you have with your healthcare provider. Document Revised: 09/15/2019 Document Reviewed: 09/15/2019 Elsevier Patient Education  2022 [employer].

## 2020-09-08 LAB — HEMOGLOBIN A1C
Est. average glucose Bld gHb Est-mCnc: 114 mg/dL
Hgb A1c MFr Bld: 5.6 % (ref 4.8–5.6)

## 2020-09-22 DIAGNOSIS — R42 Dizziness and giddiness: Secondary | ICD-10-CM

## 2020-09-26 ENCOUNTER — Other Ambulatory Visit (HOSPITAL_COMMUNITY): Payer: 59

## 2020-10-11 ENCOUNTER — Telehealth: Payer: Self-pay | Admitting: Cardiology

## 2020-10-11 NOTE — Telephone Encounter (Signed)
  Pt is returning call to get results 

## 2020-10-11 NOTE — Telephone Encounter (Signed)
Spoke with patient, see chart.    

## 2020-10-14 ENCOUNTER — Ambulatory Visit (HOSPITAL_BASED_OUTPATIENT_CLINIC_OR_DEPARTMENT_OTHER): Admission: RE | Admit: 2020-10-14 | Payer: 59 | Source: Ambulatory Visit

## 2020-11-14 ENCOUNTER — Ambulatory Visit (HOSPITAL_BASED_OUTPATIENT_CLINIC_OR_DEPARTMENT_OTHER): Admission: RE | Admit: 2020-11-14 | Payer: 59 | Source: Ambulatory Visit

## 2020-12-01 ENCOUNTER — Ambulatory Visit: Payer: 59 | Admitting: Cardiology

## 2021-03-09 ENCOUNTER — Other Ambulatory Visit: Payer: Self-pay | Admitting: Urology

## 2021-03-09 DIAGNOSIS — C61 Malignant neoplasm of prostate: Secondary | ICD-10-CM

## 2021-05-12 ENCOUNTER — Encounter (HOSPITAL_BASED_OUTPATIENT_CLINIC_OR_DEPARTMENT_OTHER): Payer: Self-pay | Admitting: Emergency Medicine

## 2021-05-12 ENCOUNTER — Emergency Department (HOSPITAL_BASED_OUTPATIENT_CLINIC_OR_DEPARTMENT_OTHER)
Admission: EM | Admit: 2021-05-12 | Discharge: 2021-05-12 | Disposition: A | Discharge status: Home / Self Care | Payer: Commercial Managed Care - HMO | Attending: Emergency Medicine | Admitting: Emergency Medicine

## 2021-05-12 ENCOUNTER — Emergency Department (HOSPITAL_BASED_OUTPATIENT_CLINIC_OR_DEPARTMENT_OTHER): Payer: Commercial Managed Care - HMO

## 2021-05-12 ENCOUNTER — Other Ambulatory Visit: Payer: Self-pay

## 2021-05-12 DIAGNOSIS — I10 Essential (primary) hypertension: Secondary | ICD-10-CM | POA: Diagnosis not present

## 2021-05-12 DIAGNOSIS — J020 Streptococcal pharyngitis: Secondary | ICD-10-CM | POA: Diagnosis not present

## 2021-05-12 DIAGNOSIS — Z8546 Personal history of malignant neoplasm of prostate: Secondary | ICD-10-CM | POA: Diagnosis not present

## 2021-05-12 DIAGNOSIS — J029 Acute pharyngitis, unspecified: Secondary | ICD-10-CM | POA: Diagnosis present

## 2021-05-12 LAB — BASIC METABOLIC PANEL
Anion gap: 7 (ref 5–15)
BUN: 15 mg/dL (ref 6–20)
CO2: 26 mmol/L (ref 22–32)
Calcium: 9.4 mg/dL (ref 8.9–10.3)
Chloride: 105 mmol/L (ref 98–111)
Creatinine, Ser: 1 mg/dL (ref 0.61–1.24)
GFR, Estimated: >60 mL/min (ref >60)
Glucose, Bld: 102 mg/dL — ABNORMAL HIGH (ref 70–99)
Potassium: 3.2 mmol/L — ABNORMAL LOW (ref 3.5–5.1)
Sodium: 138 mmol/L (ref 135–145)

## 2021-05-12 LAB — CBC WITH DIFFERENTIAL/PLATELET
Abs Immature Granulocytes: 0.04 10*3/uL (ref 0.00–0.07)
Basophils Absolute: 0 10*3/uL (ref 0.0–0.1)
Basophils Relative: 0 %
Eosinophils Absolute: 0.2 10*3/uL (ref 0.0–0.5)
Eosinophils Relative: 1 %
HCT: 40 % (ref 39.0–52.0)
Hemoglobin: 14.1 g/dL (ref 13.0–17.0)
Immature Granulocytes: 0 %
Lymphocytes Relative: 17 %
Lymphs Abs: 2.3 10*3/uL (ref 0.7–4.0)
MCH: 32.7 pg (ref 26.0–34.0)
MCHC: 35.3 g/dL (ref 30.0–36.0)
MCV: 92.8 fL (ref 80.0–100.0)
Monocytes Absolute: 1.2 10*3/uL — ABNORMAL HIGH (ref 0.1–1.0)
Monocytes Relative: 9 %
Neutro Abs: 10 10*3/uL — ABNORMAL HIGH (ref 1.7–7.7)
Neutrophils Relative %: 73 %
Platelets: 331 10*3/uL (ref 150–400)
RBC: 4.31 MIL/uL (ref 4.22–5.81)
RDW: 12.9 % (ref 11.5–15.5)
WBC: 13.7 10*3/uL — ABNORMAL HIGH (ref 4.0–10.5)
nRBC: 0 % (ref 0.0–0.2)

## 2021-05-12 LAB — GROUP A STREP BY PCR: Group A Strep by PCR: DETECTED — AB

## 2021-05-12 MED ORDER — CLINDAMYCIN HCL 300 MG PO CAPS: 300.0000 mg | ORAL_CAPSULE | Freq: Three times a day (TID) | ORAL | 0 refills | Status: DC

## 2021-05-12 MED ORDER — CLINDAMYCIN HCL 150 MG PO CAPS: 300.0000 mg | ORAL_CAPSULE | Freq: Once | ORAL | Status: DC

## 2021-05-12 MED ORDER — IOHEXOL 350 MG/ML SOLN
100.0000 mL | Freq: Once | INTRAVENOUS | Status: AC | PRN
  Administered 2021-05-12: 75 mL via INTRAVENOUS

## 2021-05-12 MED ORDER — DEXAMETHASONE SODIUM PHOSPHATE 10 MG/ML IJ SOLN
10.0000 mg | Freq: Once | INTRAMUSCULAR | Status: AC
  Administered 2021-05-12: 10 mg via INTRAVENOUS
  Filled 2021-05-12: qty 1

## 2021-05-12 NOTE — Discharge Instructions (Signed)
You tested positive for strep throat today.  Your CAT scan did not show any sign of abscess formation but does have some swelling where it looks like there may be something starting.  You were given a dose of steroids here that will last for several days and help with the swelling in your throat and you were started on an antibiotic that you need to take 3 times a day.  You were given 1 dose here so take the next dose this evening and then tomorrow start at breakfast lunch and dinner.  You can also take Tylenol or ibuprofen as needed for the discomfort.  You will need to sleep propped up so that you can breathe more easily. ?

## 2021-05-12 NOTE — ED Provider Notes (Signed)
?[facility] ?Provider Note ? ? ?CSN: [id number] ?Arrival date & time: 05/12/21  0758 ? ?  ? ?History ? ?Chief Complaint  ?Patient presents with  ? Sore Throat  ? ? ?Malik Daniels is a 56 y.o. male. ? ?Patient is a 56 year old male with a history of hypertension and stable prostate cancer that is getting regular maintenance evaluation who is presenting today with complaints of worsening sore throat.  Patient reports his symptoms started on Tuesday which is 4 days prior to arrival.  He started noticing some discomfort with swallowing.  He reports the symptoms seem to keep getting worse despite him taking NyQuil and other over-the-counter medications.  He reports it is painful when he swallows but he just feels like his throat is closing up.  He reports last night it was hard for him to sleep because when he is laying down it felt like his throat was closing.  He has not had any shortness of breath when he has been sitting up.  He does not feel like his voice has changed.  He has not noticed a fever but does occasionally have some pain in the right ear.  He has not had significant cough or nasal congestion.  He reports that kids in his home have had some runny nose but nobody with similar symptoms as himself.  He has had no prior throat surgeries such as tonsillectomy or adenoidectomy. ? ?The history is provided by the patient.  ?Sore Throat ? ? ?  ? ?Home Medications ?Prior to Admission medications   ?Medication Sig Start Date End Date Taking? Authorizing Provider  ?clindamycin (CLEOCIN) 300 MG capsule Take 1 capsule (300 mg total) by mouth 3 (three) times daily. 05/12/21  Yes Evani Shrider, Loree Fee, MD  ?amLODipine (NORVASC) 10 MG tablet TAKE 1 TABLET(10 MG) BY MOUTH DAILY 05/27/20   Saguier, Percell Miller, PA-C  ?hydrochlorothiazide (HYDRODIURIL) 25 MG tablet TAKE 1 TABLET(25 MG) BY MOUTH DAILY 05/27/20   Saguier, Percell Miller, PA-C  ?   ? ?Allergies    ?Patient has no known allergies.   ? ?Review of Systems    ?Review of Systems ? ?Physical Exam ?Updated Vital Signs ?BP (!) 150/89   Pulse 73   Temp 99 ?F (37.2 ?C) (Oral)   Resp 18   Ht 6' (1.829 m)   Wt 117.9 kg   SpO2 100%   BMI 35.26 kg/m?  ?Physical Exam ?Vitals and nursing note reviewed.  ?Constitutional:   ?   General: He is not in acute distress. ?   Appearance: He is well-developed.  ?HENT:  ?   Head: Normocephalic and atraumatic.  ?   Right Ear: There is impacted cerumen.  ?   Left Ear: A middle ear effusion is present.  ?   Nose: Congestion present.  ?   Mouth/Throat:  ?   Mouth: Mucous membranes are moist.  ?   Pharynx: Pharyngeal swelling and posterior oropharyngeal erythema present.  ?   Comments: Significant posterior pharyngeal erythema.  Unable to visualize uvula due to swelling and poor Mallampati score.  Tonsils were not well visualized. ?Eyes:  ?   Conjunctiva/sclera: Conjunctivae normal.  ?   Pupils: Pupils are equal, round, and reactive to light.  ?Neck:  ?   Comments: No stridor ?Cardiovascular:  ?   Rate and Rhythm: Normal rate and regular rhythm.  ?   Heart sounds: No murmur heard. ?Pulmonary:  ?   Effort: Pulmonary effort is normal. No respiratory distress.  ?  Breath sounds: Normal breath sounds. No wheezing or rales.  ?Musculoskeletal:     ?   General: No tenderness. Normal range of motion.  ?   Cervical back: Normal range of motion and neck supple.  ?Lymphadenopathy:  ?   Cervical: Cervical adenopathy present.  ?Skin: ?   General: Skin is warm and dry.  ?   Findings: No erythema or rash.  ?Neurological:  ?   Mental Status: He is alert and oriented to person, place, and time.  ?Psychiatric:     ?   Behavior: Behavior normal.  ? ? ?ED Results / Procedures / Treatments   ?Labs ?(all labs ordered are listed, but only abnormal results are displayed) ?Labs Reviewed  ?GROUP A STREP BY PCR - Abnormal; Notable for the following components:  ?    Result Value  ? Group A Strep by PCR DETECTED (*)   ? All other components within normal limits   ?CBC WITH DIFFERENTIAL/PLATELET - Abnormal; Notable for the following components:  ? WBC 13.7 (*)   ? Neutro Abs 10.0 (*)   ? Monocytes Absolute 1.2 (*)   ? All other components within normal limits  ?BASIC METABOLIC PANEL - Abnormal; Notable for the following components:  ? Potassium 3.2 (*)   ? Glucose, Bld 102 (*)   ? All other components within normal limits  ? ? ?EKG ?None ? ?Radiology ?CT Soft Tissue Neck W Contrast ? ?Result Date: 05/12/2021 ?CLINICAL DATA:  Soft tissue swelling, infection suspected EXAM: CT NECK WITH CONTRAST TECHNIQUE: Multidetector CT imaging of the neck was performed using the standard protocol following the bolus administration of intravenous contrast. RADIATION DOSE REDUCTION: This exam was performed according to the departmental dose-optimization program which includes automated exposure control, adjustment of the mA and/or kV according to patient size and/or use of iterative reconstruction technique. CONTRAST:  57m OMNIPAQUE IOHEXOL 350 MG/ML SOLN COMPARISON:  None. FINDINGS: Pharynx and larynx: Prominence of the adenoids. Prominence of the palatine tonsils, greater on the right. There is mild heterogeneity with some ill-defined areas of low density. Oropharyngeal wall thickening probably reflects edema. There is also thickening of the uvula. Epiglottis is normal. Larynx is unremarkable. There is infiltration of the right parapharyngeal fat. Salivary glands: Parotid and submandibular glands are unremarkable. Thyroid: Normal. Lymph nodes: Asymmetric top normal and mildly enlarged right level 2 lymph nodes are likely reactive. Vascular: Major neck vessels are patent. Limited intracranial: No abnormal enhancement. Filling defect within the posterosuperior sagittal sinus favored to reflect an arachnoid granulation. Visualized orbits: Unremarkable. Mastoids and visualized paranasal sinuses: No significant opacification. Skeleton: Bulky anterior bridging osteophytes at C4-C6. Multifocal  tooth decay. There are periapical lucencies about the posterior right mandibular molar. Upper chest: No apical lung mass. Other: None. IMPRESSION: Tonsillitis/pharyngitis. Likely some phlegmonous change in the right palatine tonsil region extending to midline but no drainable abscess. Electronically Signed   By: PMacy MisM.D.   On: 05/12/2021 10:05   ? ?Procedures ?Procedures  ? ? ?Medications Ordered in ED ?Medications  ?clindamycin (CLEOCIN) capsule 300 mg (has no administration in time range)  ?dexamethasone (DECADRON) injection 10 mg (10 mg Intravenous Given 05/12/21 0845)  ?iohexol (OMNIPAQUE) 350 MG/ML injection 100 mL (75 mLs Intravenous Contrast Given 05/12/21 0945)  ? ? ?ED Course/ Medical Decision Making/ A&P ?  ?                        ?Medical Decision Making ?Amount and/or Complexity of  Data Reviewed ?Labs: ordered. Decision-making details documented in ED Course. ?Radiology: ordered and independent interpretation performed. Decision-making details documented in ED Course. ? ?Risk ?Prescription drug management. ? ? ?Patient is a 56 year old male presenting today with complaints of throat pain.  Patient has significant swelling and erythema in the back of the throat and difficult to identify structures.  Concern for peritonsillar abscess versus strep pharyngitis versus possible retropharyngeal abscess.  Low suspicion for epiglottitis at this time.  Patient is not stridorous but does have mild muffled voice.  I independently interpreted patient's labs today which showed a leukocytosis of 13,000 as well as being strep positive.  BNP the normal limits.  We will do a CT soft tissue neck to rule out above pathology.  Patient was given a dose of Decadron for swelling. ? ?10:22 AM ?I independently visualized and interpreted patient's CT which does show some swelling in the posterior pharynx.  Radiology reports no evidence of abscess at this time but concern for developing phlegmon on the right side.  On exam  patient definitely has more erythema and swelling on the right.  He was started on clindamycin 300 mg 3 times daily.  He was given return precautions. ? ? ? ? ? ? ? ?Final Clinical Impression(s) / ED Diagnoses ?Fi

## 2021-05-12 NOTE — ED Notes (Signed)
Pt ambulatory to waiting room. Pt verbalized understanding of discharge instructions.   

## 2021-05-12 NOTE — ED Triage Notes (Signed)
Pt reports sore throat and trouble swallowing that started on Tuesday. Pt has tried OTC medication with no relief.  ?

## 2021-06-06 ENCOUNTER — Other Ambulatory Visit: Payer: Self-pay | Admitting: Medical

## 2021-12-10 ENCOUNTER — Other Ambulatory Visit: Payer: Self-pay

## 2021-12-10 ENCOUNTER — Encounter (HOSPITAL_BASED_OUTPATIENT_CLINIC_OR_DEPARTMENT_OTHER): Payer: Self-pay | Admitting: Emergency Medicine

## 2021-12-10 DIAGNOSIS — J101 Influenza due to other identified influenza virus with other respiratory manifestations: Secondary | ICD-10-CM | POA: Diagnosis not present

## 2021-12-10 DIAGNOSIS — Z1152 Encounter for screening for COVID-19: Secondary | ICD-10-CM | POA: Insufficient documentation

## 2021-12-10 DIAGNOSIS — R059 Cough, unspecified: Secondary | ICD-10-CM | POA: Diagnosis present

## 2021-12-10 LAB — RESP PANEL BY RT-PCR (FLU A&B, COVID) ARPGX2
Influenza A by PCR: POSITIVE — AB
Influenza B by PCR: NEGATIVE
SARS Coronavirus 2 by RT PCR: NEGATIVE

## 2021-12-10 MED ORDER — IBUPROFEN 400 MG PO TABS
400.0000 mg | ORAL_TABLET | Freq: Once | ORAL | Status: AC
  Administered 2021-12-11: 400 mg via ORAL
  Filled 2021-12-10: qty 1

## 2021-12-10 MED ORDER — ACETAMINOPHEN 500 MG PO TABS
1000.0000 mg | ORAL_TABLET | Freq: Once | ORAL | Status: AC
  Administered 2021-12-10: 1000 mg via ORAL
  Filled 2021-12-10: qty 2

## 2021-12-10 NOTE — ED Triage Notes (Signed)
Pt reports cough x 2 d; fever and body aches since last night

## 2021-12-11 ENCOUNTER — Emergency Department (HOSPITAL_BASED_OUTPATIENT_CLINIC_OR_DEPARTMENT_OTHER)
Admission: EM | Admit: 2021-12-11 | Discharge: 2021-12-11 | Disposition: A | Discharge status: Home / Self Care | Payer: Commercial Managed Care - HMO | Attending: Emergency Medicine | Admitting: Emergency Medicine

## 2021-12-11 DIAGNOSIS — J111 Influenza due to unidentified influenza virus with other respiratory manifestations: Secondary | ICD-10-CM

## 2021-12-11 HISTORY — DX: Malignant neoplasm of prostate: C61

## 2021-12-11 HISTORY — DX: Essential (primary) hypertension: I10

## 2021-12-11 NOTE — ED Notes (Signed)
D/c paperwork reviewed with pt, including f/u care.  Pt with no questions or concerns at time of d/c. Pt ambulatory to ED exit without assistance, on RA.

## 2021-12-11 NOTE — ED Notes (Signed)
ED Provider at bedside. 

## 2021-12-11 NOTE — ED Provider Notes (Signed)
[facility] Provider Note   CSN: [id number] Arrival date & time: 12/10/21  2140     History  Chief Complaint  Patient presents with   Cough    Malik Daniels is a 56 y.o. male.  The history is provided by the patient.  Cough Cough characteristics:  Non-productive Severity:  Moderate Onset quality:  Gradual Duration:  2 days Timing:  Intermittent Progression:  Unchanged Chronicity:  New Context: upper respiratory infection   Relieved by:  Nothing Worsened by:  Nothing Ineffective treatments:  None tried Associated symptoms: fever and myalgias   Associated symptoms: no chest pain and no sore throat   Risk factors: no chemical exposure        Home Medications Prior to Admission medications   Medication Sig Start Date End Date Taking? Authorizing Provider  amLODipine (NORVASC) 10 MG tablet TAKE 1 TABLET(10 MG) BY MOUTH DAILY 06/06/21   Saguier, Percell Miller, PA-C  clindamycin (CLEOCIN) 300 MG capsule Take 1 capsule (300 mg total) by mouth 3 (three) times daily. 05/12/21   Blanchie Dessert, MD  hydrochlorothiazide (HYDRODIURIL) 25 MG tablet TAKE 1 TABLET(25 MG) BY MOUTH DAILY 06/06/21   Saguier, Percell Miller, PA-C      Allergies    Patient has no known allergies.    Review of Systems   Review of Systems  Constitutional:  Positive for fever.  HENT:  Negative for sore throat.   Respiratory:  Positive for cough.   Cardiovascular:  Negative for chest pain.  Gastrointestinal:  Negative for vomiting.  Musculoskeletal:  Positive for myalgias.  All other systems reviewed and are negative.   Physical Exam Updated Vital Signs BP (!) 148/75   Pulse 73   Temp (!) 101.3 F (38.5 C)   Resp 20   SpO2 94%  Physical Exam Vitals and nursing note reviewed.  Constitutional:      General: He is not in acute distress.    Appearance: He is well-developed. He is not diaphoretic.  HENT:     Head: Normocephalic and atraumatic.     Nose: Nose normal.  Eyes:      Conjunctiva/sclera: Conjunctivae normal.     Pupils: Pupils are equal, round, and reactive to light.  Cardiovascular:     Rate and Rhythm: Normal rate and regular rhythm.     Pulses: Normal pulses.     Heart sounds: Normal heart sounds.  Pulmonary:     Effort: Pulmonary effort is normal.     Breath sounds: Normal breath sounds. No wheezing or rales.  Abdominal:     General: Bowel sounds are normal.     Palpations: Abdomen is soft.     Tenderness: There is no abdominal tenderness. There is no guarding or rebound.  Musculoskeletal:        General: Normal range of motion.     Cervical back: Normal range of motion and neck supple.  Skin:    General: Skin is warm and dry.     Capillary Refill: Capillary refill takes less than 2 seconds.  Neurological:     General: No focal deficit present.     Mental Status: He is alert and oriented to person, place, and time.  Psychiatric:        Mood and Affect: Mood normal.        Behavior: Behavior normal.     ED Results / Procedures / Treatments   Labs (all labs ordered are listed, but only abnormal results are displayed) Labs Reviewed  RESP PANEL BY RT-PCR (FLU A&B, COVID) ARPGX2 - Abnormal; Notable for the following components:      Result Value   Influenza A by PCR POSITIVE (*)    All other components within normal limits    EKG None  Radiology No results found.  Procedures Procedures    Medications Ordered in ED Medications  ibuprofen (ADVIL) tablet 400 mg (has no administration in time range)  acetaminophen (TYLENOL) tablet 1,000 mg (1,000 mg Oral Given 12/10/21 2202)    ED Course/ Medical Decision Making/ A&P                           Medical Decision Making Patient with fever, cough and bodyaches x 2 or so days.  Works in child care   Amount and/or Complexity of Data Reviewed External Data Reviewed: notes.    Details: Previous notes reviewed  Labs: ordered.    Details: Flu is positive   Risk OTC  drugs. Prescription drug management. Risk Details: Alternate tylenol and ibuprofen.  Well appearing.  Stable for discharge.  Strict return.      Final Clinical Impression(s) / ED Diagnoses Final diagnoses:  None   Return for intractable cough, coughing up blood, fevers > 100.4 unrelieved by medication, shortness of breath, intractable vomiting, chest pain, shortness of breath, weakness, numbness, changes in speech, facial asymmetry, abdominal pain, passing out, Inability to tolerate liquids or food, cough, altered mental status or any concerns. No signs of systemic illness or infection. The patient is nontoxic-appearing on exam and vital signs are within normal limits.  I have reviewed the triage vital signs and the nursing notes. Pertinent labs & imaging results that were available during my care of the patient were reviewed by me and considered in my medical decision making (see chart for details). After history, exam, and medical workup I feel the patient has been appropriately medically screened and is safe for discharge home. Pertinent diagnoses were discussed with the patient. Patient was given return precautions.  Rx / DC Orders ED Discharge Orders     None         Brahim Dolman, MD 12/11/21 4259

## 2022-03-12 ENCOUNTER — Ambulatory Visit (HOSPITAL_BASED_OUTPATIENT_CLINIC_OR_DEPARTMENT_OTHER)
Admission: RE | Admit: 2022-03-12 | Discharge: 2022-03-12 | Disposition: A | Discharge status: Home / Self Care | Payer: 59 | Source: Ambulatory Visit | Attending: Medical | Admitting: Medical

## 2022-03-12 ENCOUNTER — Encounter: Payer: Self-pay | Admitting: Medical

## 2022-03-12 ENCOUNTER — Ambulatory Visit (INDEPENDENT_AMBULATORY_CARE_PROVIDER_SITE_OTHER): Payer: 59 | Admitting: Medical

## 2022-03-12 VITALS — BP 130/75 | HR 68 | Temp 98.0°F | Resp 18 | Ht 72.0 in | Wt 272.0 lb

## 2022-03-12 DIAGNOSIS — M25511 Pain in right shoulder: Secondary | ICD-10-CM | POA: Insufficient documentation

## 2022-03-12 DIAGNOSIS — R252 Cramp and spasm: Secondary | ICD-10-CM

## 2022-03-12 DIAGNOSIS — Z23 Encounter for immunization: Secondary | ICD-10-CM | POA: Diagnosis not present

## 2022-03-12 DIAGNOSIS — E785 Hyperlipidemia, unspecified: Secondary | ICD-10-CM | POA: Diagnosis not present

## 2022-03-12 DIAGNOSIS — Z0000 Encounter for general adult medical examination without abnormal findings: Secondary | ICD-10-CM

## 2022-03-12 DIAGNOSIS — C61 Malignant neoplasm of prostate: Secondary | ICD-10-CM

## 2022-03-12 DIAGNOSIS — I10 Essential (primary) hypertension: Secondary | ICD-10-CM | POA: Diagnosis not present

## 2022-03-12 DIAGNOSIS — Z1211 Encounter for screening for malignant neoplasm of colon: Secondary | ICD-10-CM | POA: Diagnosis not present

## 2022-03-12 LAB — MAGNESIUM: Magnesium: 2 mg/dL (ref 1.5–2.5)

## 2022-03-12 MED ORDER — HYDROCHLOROTHIAZIDE 25 MG PO TABS
ORAL_TABLET | ORAL | 3 refills | Status: DC
Start: 2022-03-12 — End: 2023-04-01

## 2022-03-12 MED ORDER — AMLODIPINE BESYLATE 10 MG PO TABS: ORAL_TABLET | ORAL | 3 refills | Status: DC

## 2022-03-12 NOTE — Addendum Note (Signed)
Addended by: Jeronimo Greaves on: 03/12/2022 11:45 AM   Modules accepted: Orders

## 2022-03-12 NOTE — Addendum Note (Signed)
Addended by: Anabel Halon on: 03/12/2022 01:07 PM   Modules accepted: Orders

## 2022-03-12 NOTE — Patient Instructions (Addendum)
For you wellness exam today I have ordered cbc, cmp and  lipid panel.  Hx of prostate cancer- being followed by urologist. Get psa today and have you schedule follow up with urologist.  Flu vaccine and shingrix vaccine today.  Please get scheduled for colonoscopy. Recommend you calling office directly.  Recommend exercise and healthy diet.  We will let you know lab results as they come in.  Follow up date appointment will be determined after lab review.    Htn- bp controlled on recheck. Refilled bp med today.  Mild high cholesterol- recheck lipid panel today.  Rt shoulder xray today.  For cramping rt leg.add mag level.   Preventive Care 14-55 Years Old, Male Preventive care refers to lifestyle choices and visits with your health care provider that can promote health and wellness. Preventive care visits are also called wellness exams. What can I expect for my preventive care visit? Counseling During your preventive care visit, your health care provider may ask about your: Medical history, including: Past medical problems. Family medical history. Current health, including: Emotional well-being. Home life and relationship well-being. Sexual activity. Lifestyle, including: Alcohol, nicotine or tobacco, and drug use. Access to firearms. Diet, exercise, and sleep habits. Safety issues such as seatbelt and bike helmet use. Sunscreen use. Work and work Statistician. Physical exam Your health care provider will check your: Height and weight. These may be used to calculate your BMI (body mass index). BMI is a measurement that tells if you are at a healthy weight. Waist circumference. This measures the distance around your waistline. This measurement also tells if you are at a healthy weight and may help predict your risk of certain diseases, such as type 2 diabetes and high blood pressure. Heart rate and blood pressure. Body temperature. Skin for abnormal spots. What  immunizations do I need?  Vaccines are usually given at various ages, according to a schedule. Your health care provider will recommend vaccines for you based on your age, medical history, and lifestyle or other factors, such as travel or where you work. What tests do I need? Screening Your health care provider may recommend screening tests for certain conditions. This may include: Lipid and cholesterol levels. Diabetes screening. This is done by checking your blood sugar (glucose) after you have not eaten for a while (fasting). Hepatitis B test. Hepatitis C test. HIV (human immunodeficiency virus) test. STI (sexually transmitted infection) testing, if you are at risk. Lung cancer screening. Prostate cancer screening. Colorectal cancer screening. Talk with your health care provider about your test results, treatment options, and if necessary, the need for more tests. Follow these instructions at home: Eating and drinking  Eat a diet that includes fresh fruits and vegetables, whole grains, lean protein, and low-fat dairy products. Take vitamin and mineral supplements as recommended by your health care provider. Do not drink alcohol if your health care provider tells you not to drink. If you drink alcohol: Limit how much you have to 0-2 drinks a day. Know how much alcohol is in your drink. In the [place] one drink equals one 12 oz bottle of beer (355 mL), one 5 oz glass of wine (148 mL), or one 1 oz glass of hard liquor (44 mL). Lifestyle Brush your teeth every morning and night with fluoride toothpaste. Floss one time each day. Exercise for at least 30 minutes 5 or more days each week. Do not use any products that contain nicotine or tobacco. These products include cigarettes, chewing tobacco, and vaping  devices, such as e-cigarettes. If you need help quitting, ask your health care provider. Do not use drugs. If you are sexually active, practice safe sex. Use a condom or other form of  protection to prevent STIs. Take aspirin only as told by your health care provider. Make sure that you understand how much to take and what form to take. Work with your health care provider to find out whether it is safe and beneficial for you to take aspirin daily. Find healthy ways to manage stress, such as: Meditation, yoga, or listening to music. Journaling. Talking to a trusted person. Spending time with friends and family. Minimize exposure to UV radiation to reduce your risk of skin cancer. Safety Always wear your seat belt while driving or riding in a vehicle. Do not drive: If you have been drinking alcohol. Do not ride with someone who has been drinking. When you are tired or distracted. While texting. If you have been using any mind-altering substances or drugs. Wear a helmet and other protective equipment during sports activities. If you have firearms in your house, make sure you follow all gun safety procedures. What's next? Go to your health care provider once a year for an annual wellness visit. Ask your health care provider how often you should have your eyes and teeth checked. Stay up to date on all vaccines. This information is not intended to replace advice given to you by your health care provider. Make sure you discuss any questions you have with your health care provider. Document Revised: 07/20/2020 Document Reviewed: 07/20/2020 Elsevier Patient Education  [employer].

## 2022-03-12 NOTE — Progress Notes (Signed)
Subjective:    Patient ID: Malik Daniels, male    DOB: [birth year], 57 y.o.   MRN: [id number]  HPI   Here for wellness exam. 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 Not much exercise at all. Moderate healthy diet. But sometimes too much fried foods and to much sugar. Non smoker. No alcohol.    Htn- bp high today initially.  Pt amlodipine 10 mg daily and hctz 25 mg daily. Took meds today and has not missed any dosage in past week. On recheck better bp.  Hx of prostate CA. Being follow by urologist.   Pt got flu vaccine today.  Will get shingrix vaccine as well.  Mild high cholesterol in that past.  The 10-year ASCVD risk score (Arnett DK, et al., 2019) is: 11.5%   Values used to calculate the score:     Age: 54 years     Sex: Male     Is Non-Hispanic African American: Yes     Diabetic: No     Tobacco smoker: No     Systolic Blood Pressure: 381 mmHg     Is BP treated: Yes     HDL Cholesterol: 44.2 mg/dL     Total Cholesterol: 163 mg/dL   Pt told rheumatologist has OA. He is only on tylenol as needed.   Rt shoulder pain for 2 week. Hurts on range of motion. No injury or fall.   At times medial thigh and hamstring "charlie horse". None presently.  Review of Systems  Constitutional:  Negative for chills, fatigue and fever.  HENT:  Negative for congestion and ear discharge.   Respiratory:  Negative for cough, chest tightness, shortness of breath and wheezing.   Cardiovascular:  Negative for chest pain and palpitations.  Gastrointestinal:  Negative for abdominal pain, blood in stool, diarrhea and nausea.  Genitourinary:  Negative for difficulty urinating, flank pain, frequency, penile pain and testicular pain.  Musculoskeletal:  Negative for back pain, myalgias, neck pain and neck stiffness.  Skin:  Negative for rash.    Past Medical History:  Diagnosis Date   Hypertension    Prostate cancer [facility])      Social History   Socioeconomic History    Marital status: Married    Spouse name: Not on file   Number of children: Not on file   Years of education: Not on file   Highest education level: Not on file  Occupational History   Not on file  Tobacco Use   Smoking status: Never   Smokeless tobacco: Never  Substance and Sexual Activity   Alcohol use: No   Drug use: No   Sexual activity: Not on file  Other Topics Concern   Not on file  Social History Narrative   Not on file   Social Determinants of Health   Financial Resource Strain: Not on file  Food Insecurity: Not on file  Transportation Needs: Not on file  Physical Activity: Not on file  Stress: Not on file  Social Connections: Not on file  Intimate Partner Violence: Not on file    Past Surgical History:  Procedure Laterality Date   NONE      Family History  Problem Relation Age of Onset   Aneurysm Mother    Hypertension Mother    Heart Problems Sister        Takes Coumadin   Congestive Heart Failure Brother    Congestive Heart Failure Brother    Diabetes Brother  Kidney disease Brother     No Known Allergies  No current outpatient medications on file prior to visit.   No current facility-administered medications on file prior to visit.    BP 130/75   Pulse 68   Temp 98 F (36.7 C)   Resp 18   Ht 6' (1.829 m)   Wt 272 lb (123.4 kg)   SpO2 96%   BMI 36.89 kg/m        Objective:   Physical Exam  General Mental Status- Alert. General Appearance- Not in acute distress.   Skin General: Color- Normal Color. Moisture- Normal Moisture.  Neck Carotid Arteries- Normal color. Moisture- Normal Moisture. No carotid bruits. No JVD.  Chest and Lung Exam Auscultation: Breath Sounds:-Normal.  Cardiovascular Auscultation:Rythm- Regular. Murmurs & Other Heart Sounds:Auscultation of the heart reveals- No Murmurs.  Abdomen Inspection:-Inspeection Normal. Palpation/Percussion:Note:No mass. Palpation and Percussion of the abdomen reveal- Non  Tender, Non Distended + BS, no rebound or guarding.   Neurologic Cranial Nerve exam:- CN III-XII intact(No nystagmus), symmetric smile. Strength:- 5/5 equal and symmetric strength both upper and lower extremities.    Rt shoulder- pain on range of motion.  Rt lower ext- on palpation no abnormality I thigh or hamstring are.     Assessment & Plan:   Patient Instructions  For you wellness exam today I have ordered cbc, cmp and  lipid panel.  Hx of prostate cancer- being followed by urologist. Get psa today and have you schedule follow up with urologist.  Flu vaccine and shingrix vaccine today.  Please get scheduled for colonoscopy. Recommend you calling office directly.  Recommend exercise and healthy diet.  We will let you know lab results as they come in.  Follow up date appointment will be determined after lab review.    Htn- bp controlled on recheck. Refilled bp med today.  Mild high choleserol- recheck lipid panel today.     99213 charge as not seen in almost 2 years. Bp controlled did refill his bp meds 90 tab with 3 refills. Also discussed mid ldl elevation in past. Explained may rx cholesterol med based on ascvd score. Also addressed rt shoulder pain and leg cramp.

## 2022-04-03 ENCOUNTER — Encounter: Payer: Self-pay | Admitting: Family Medicine

## 2022-04-03 ENCOUNTER — Ambulatory Visit (INDEPENDENT_AMBULATORY_CARE_PROVIDER_SITE_OTHER): Payer: 59 | Admitting: Family Medicine

## 2022-04-03 VITALS — BP 130/86 | Ht 72.0 in | Wt 266.0 lb

## 2022-04-03 DIAGNOSIS — M7541 Impingement syndrome of right shoulder: Secondary | ICD-10-CM | POA: Insufficient documentation

## 2022-04-03 MED ORDER — MELOXICAM 15 MG PO TABS: ORAL_TABLET | ORAL | 1 refills | Status: AC

## 2022-04-03 NOTE — Patient Instructions (Signed)
Nice to meet you Please try heat before exercise and ice after  Please try the exercises   Please send me a message in [place] with any questions or updates.  Please see me back in 4-6 weeks.   --Dr. Raeford Razor

## 2022-04-03 NOTE — Assessment & Plan Note (Signed)
Acutely occurring.  Has good range of motion and strength.  Pain is inconsistent and intermittent.  More consistent with an impingement at this time. -Counseled on home exercise therapy and supportive care. -Meloxicam. -Could consider injection or physical therapy.

## 2022-04-03 NOTE — Progress Notes (Signed)
  Malik Daniels - 57 y.o. male MRN [id number]  Date of birth: 1965-02-07  SUBJECTIVE:  Including CC & ROS.  No chief complaint on file.   Malik Daniels is a 57 y.o. male that is presenting with acute right shoulder pain.  The pain is occurring over the lateral aspect.  No injury or inciting event.  Pain is worse with certain abduction movements.  No history of similar pain.  No history of surgery in the area.   Independent review of the right shoulder x-ray from 2/8 shows no acute changes.  Review of Systems See HPI   HISTORY: Past Medical, Surgical, Social, and Family History Reviewed & Updated per EMR.   Pertinent Historical Findings include:  Past Medical History:  Diagnosis Date   Hypertension    Prostate cancer ([place])     Past Surgical History:  Procedure Laterality Date   NONE       PHYSICAL EXAM:  VS: BP 130/86   Ht 6' (1.829 m)   Wt 266 lb (120.7 kg)   BMI 36.08 kg/m  Physical Exam Gen: NAD, alert, cooperative with exam, well-appearing MSK:  Neurovascularly intact       ASSESSMENT & PLAN:   Shoulder impingement syndrome, right Acutely occurring.  Has good range of motion and strength.  Pain is inconsistent and intermittent.  More consistent with an impingement at this time. -Counseled on home exercise therapy and supportive care. -Meloxicam. -Could consider injection or physical therapy.

## 2022-04-11 ENCOUNTER — Encounter: Payer: Self-pay | Admitting: Internal Medicine

## 2022-04-17 ENCOUNTER — Encounter: Payer: Self-pay | Admitting: Internal Medicine

## 2022-04-17 ENCOUNTER — Ambulatory Visit (AMBULATORY_SURGERY_CENTER): Payer: 59 | Admitting: *Deleted

## 2022-04-17 VITALS — Ht 72.0 in | Wt 267.0 lb

## 2022-04-17 DIAGNOSIS — Z1211 Encounter for screening for malignant neoplasm of colon: Secondary | ICD-10-CM

## 2022-04-17 MED ORDER — NA SULFATE-K SULFATE-MG SULF 17.5-3.13-1.6 GM/177ML PO SOLN: 1.0000 | Freq: Once | ORAL | 0 refills | Status: AC

## 2022-04-17 NOTE — Progress Notes (Signed)
No egg or soy allergy known to patient  No issues known to pt with past sedation with any surgeries or procedures Patient denies ever being intubated No issues moving head or neck  No issues with swallowing  No FH of Malignant Hyperthermia Pt is not on diet pills Pt is not on  home 02  Pt is not on blood thinners  Pt denies issues with constipation  Pt is not on dialysis Pt denies any upcoming cardiac testing Pt encouraged to use to use Singlecare or Goodrx to reduce cost right ear achedermatitis Patient's chart reviewed by Osvaldo Angst CNRA prior to previsit and patient appropriate for the [place].  Previsit completed and red dot placed by patient's name on their procedure day (on provider's schedule).   Visit person Pt's weight per in office scale read : 267 lb Instructions reviewed with pt and pt states understanding. Instructed to review again prior to procedure. Pt states they will. Instructions given to pt  Instructions and coupon given to pt

## 2022-05-06 ENCOUNTER — Telehealth: Payer: Self-pay | Admitting: Gastroenterology

## 2022-05-06 NOTE — Telephone Encounter (Signed)
Patient called on call provider this evening (around 8:15pm) because he had an episode of emesis during his bowel prep.  He is scheduled for a colonoscopy tomorrow at 4pm with Dr. Henrene Pastor.   He feels fine now, but about an hour after he finished his first dose of his prep he felt suddenly nauseated and vomited clear fluid.  He has had one bowel movement so far.  He is scheduled to take his second dose at 11 am.   I advised him to plan to take the second dose as scheduled, but to contact the Endoscopy unit in the morning if he has not had any more bowel movements between now and when he needs to take his second dose.  He may need to take some extra laxatives in order to achieve an adequate prep.

## 2022-05-07 ENCOUNTER — Ambulatory Visit (AMBULATORY_SURGERY_CENTER): Payer: 59 | Admitting: Internal Medicine

## 2022-05-07 ENCOUNTER — Ambulatory Visit: Payer: 59 | Admitting: Family Medicine

## 2022-05-07 ENCOUNTER — Encounter: Payer: Self-pay | Admitting: Internal Medicine

## 2022-05-07 ENCOUNTER — Telehealth: Payer: Self-pay | Admitting: Internal Medicine

## 2022-05-07 VITALS — BP 139/77 | HR 80 | Temp 98.9°F | Resp 12 | Ht 72.0 in | Wt 267.0 lb

## 2022-05-07 DIAGNOSIS — I10 Essential (primary) hypertension: Secondary | ICD-10-CM | POA: Diagnosis not present

## 2022-05-07 DIAGNOSIS — D123 Benign neoplasm of transverse colon: Secondary | ICD-10-CM | POA: Diagnosis not present

## 2022-05-07 DIAGNOSIS — Z1211 Encounter for screening for malignant neoplasm of colon: Secondary | ICD-10-CM | POA: Diagnosis not present

## 2022-05-07 DIAGNOSIS — K635 Polyp of colon: Secondary | ICD-10-CM | POA: Diagnosis not present

## 2022-05-07 MED ORDER — SODIUM CHLORIDE 0.9 % IV SOLN: 500.0000 mL | Freq: Once | INTRAVENOUS | Status: DC

## 2022-05-07 NOTE — Progress Notes (Unsigned)
HISTORY OF PRESENT ILLNESS:  Malik Daniels is a 57 y.o. male who is sent for screening colonoscopy.  No complaints  REVIEW OF SYSTEMS:  All non-GI ROS negative  Past Medical History:  Diagnosis Date   Hypertension    Prostate cancer [facility])     Past Surgical History:  Procedure Laterality Date   NONE      Social History Malik Daniels  reports that he has never smoked. He has never used smokeless tobacco. He reports that he does not drink alcohol and does not use drugs.  family history includes Aneurysm in his mother; Breast cancer in his sister and sister; Congestive Heart Failure in his brother and brother; Diabetes in his brother; Heart Problems in his sister; Hypertension in his mother; Kidney disease in his brother; Prostate cancer in his brother; Uterine cancer in his sister.  No Known Allergies     PHYSICAL EXAMINATION: Vital signs: See input from nursing for today's vitals General: Well-developed, well-nourished, no acute distress HEENT: Sclerae are anicteric, conjunctiva pink. Oral mucosa intact Lungs: Clear Heart: Regular Abdomen: soft, nontender, nondistended, no obvious ascites, no peritoneal signs, normal bowel sounds. No organomegaly. Extremities: No edema Psychiatric: alert and oriented x3. Cooperative     ASSESSMENT:  Colon cancer screening   PLAN:   Screening colonoscopy

## 2022-05-07 NOTE — Progress Notes (Unsigned)
Called to room to assist during endoscopic procedure.  Patient ID and intended procedure confirmed with present staff. Received instructions for my participation in the procedure from the performing physician.  

## 2022-05-07 NOTE — Telephone Encounter (Signed)
PT has colonoscopy at 4pm today and his prep has not fully cleansed him and he is concerned he will not be prepared. Please advise.

## 2022-05-07 NOTE — Op Note (Signed)
[place] Patient Name: Malik Daniels Procedure Date: 05/07/2022 3:17 PM MRN: [id number] Endoscopist: Docia Chuck. Henrene Pastor , MD, [id number] Age: 57 Referring MD:  Date of Birth: 1965-12-16 Gender: Male Account #: [id number] Procedure:                Colonoscopy with cold snare polypectomy x 1 Indications:              Screening for colorectal malignant neoplasm Medicines:                Monitored Anesthesia Care Procedure:                Pre-Anesthesia Assessment:                           - Prior to the procedure, a History and Physical                            was performed, and patient medications and                            allergies were reviewed. The patient's tolerance of                            previous anesthesia was also reviewed. The risks                            and benefits of the procedure and the sedation                            options and risks were discussed with the patient.                            All questions were answered, and informed consent                            was obtained. Prior Anticoagulants: The patient has                            taken no anticoagulant or antiplatelet agents.                            After reviewing the risks and benefits, the patient                            was deemed in satisfactory condition to undergo the                            procedure.                           After obtaining informed consent, the colonoscope                            was passed under direct vision. Throughout the  procedure, the patient's blood pressure, pulse, and                            oxygen saturations were monitored continuously. The                            Olympus CF-HQ190L [id number]) Colonoscope was                            introduced through the anus and advanced to the the                            cecum, identified by appendiceal orifice and                            ileocecal  valve. The ileocecal valve, appendiceal                            orifice, and rectum were photographed. The quality                            of the bowel preparation was good. The colonoscopy                            was performed without difficulty. The patient                            tolerated the procedure well. The bowel preparation                            used was SUPREP via split dose instruction. Scope In: 3:27:16 PM Scope Out: 3:47:47 PM Scope Withdrawal Time: 0 hours 15 minutes 57 seconds  Total Procedure Duration: 0 hours 20 minutes 31 seconds  Findings:                 A 3 mm polyp was found in the transverse colon. The                            polyp was removed with a cold snare. Resection and                            retrieval were complete.                           The exam was otherwise without abnormality on                            direct and retroflexion views. Complications:            No immediate complications. Estimated blood loss:                            None. Estimated Blood Loss:     Estimated blood loss: none. Impression:               -  One 3 mm polyp in the transverse colon, removed                            with a cold snare. Resected and retrieved.                           - The examination was otherwise normal on direct                            and retroflexion views. Recommendation:           - Repeat colonoscopy in 7-10 years for surveillance.                           - Patient has a contact number available for                            emergencies. The signs and symptoms of potential                            delayed complications were discussed with the                            patient. Return to normal activities tomorrow.                            Written discharge instructions were provided to the                            patient.                           - Resume previous diet.                           -  Continue present medications.                           - Await pathology results. Docia Chuck. Henrene Pastor, MD 05/07/2022 3:52:05 PM This report has been signed electronically.

## 2022-05-07 NOTE — Telephone Encounter (Signed)
Pt was concerned about his prep results. Pt stated that his last BM was liquid, and yellow in color. He did not notice any solid pieces, and could see through to the bottom of the toilet. Advised patient that he should be fine for his procedure this afternoon. Offered patient to come in early if he could due to cancellations in MD schedule for today. Pt verbalized understanding and had no further concerns at the end of the call.

## 2022-05-07 NOTE — Progress Notes (Unsigned)
Pt's states no medical or surgical changes since previsit or office visit. 

## 2022-05-07 NOTE — Patient Instructions (Addendum)
Handouts Provided:  Polyps  Repeat colonoscopy in 7-10 years for surveillance.   YOU HAD AN ENDOSCOPIC PROCEDURE TODAY AT [facility]:   Refer to the procedure report that was given to you for any specific questions about what was found during the examination.  If the procedure report does not answer your questions, please call your gastroenterologist to clarify.  If you requested that your care partner not be given the details of your procedure findings, then the procedure report has been included in a sealed envelope for you to review at your convenience later.  YOU SHOULD EXPECT: Some feelings of bloating in the abdomen. Passage of more gas than usual.  Walking can help get rid of the air that was put into your GI tract during the procedure and reduce the bloating. If you had a lower endoscopy (such as a colonoscopy or flexible sigmoidoscopy) you may notice spotting of blood in your stool or on the toilet paper. If you underwent a bowel prep for your procedure, you may not have a normal bowel movement for a few days.  Please Note:  You might notice some irritation and congestion in your nose or some drainage.  This is from the oxygen used during your procedure.  There is no need for concern and it should clear up in a day or so.  SYMPTOMS TO REPORT IMMEDIATELY:  Following lower endoscopy (colonoscopy or flexible sigmoidoscopy):  Excessive amounts of blood in the stool  Significant tenderness or worsening of abdominal pains  Swelling of the abdomen that is new, acute  Fever of 100F or higher  For urgent or emergent issues, a gastroenterologist can be reached at any hour by calling [phone]. Do not use MyChart messaging for urgent concerns.    DIET:  We do recommend a small meal at first, but then you may proceed to your regular diet.  Drink plenty of fluids but you should avoid alcoholic beverages for 24 hours.  ACTIVITY:  You should plan to take it easy for the rest  of today and you should NOT DRIVE or use heavy machinery until tomorrow (because of the sedation medicines used during the test).    FOLLOW UP: Our staff will call the number listed on your records the next business day following your procedure.  We will call around 7:15- 8:00 am to check on you and address any questions or concerns that you may have regarding the information given to you following your procedure. If we do not reach you, we will leave a message.     If any biopsies were taken you will be contacted by phone or by letter within the next 1-3 weeks.  Please call us at [phone] if you have not heard about the biopsies in 3 weeks.    SIGNATURES/CONFIDENTIALITY: You and/or your care partner have signed paperwork which will be entered into your electronic medical record.  These signatures attest to the fact that that the information above on your After Visit Summary has been reviewed and is understood.  Full responsibility of the confidentiality of this discharge information lies with you and/or your care-partner.

## 2022-05-07 NOTE — Progress Notes (Unsigned)
Report to PACU, RN, vss, BBS= Clear.  

## 2022-05-08 ENCOUNTER — Telehealth: Payer: Self-pay | Admitting: *Deleted

## 2022-05-08 NOTE — Telephone Encounter (Signed)
  Follow up Call-     05/07/2022    3:09 PM  Call back number  Post procedure Call Back phone  # [phone]  Permission to leave phone message Yes     Patient questions:  Do you have a fever, pain , or abdominal swelling? No. Pain Score  0 *  Have you tolerated food without any problems? Yes.    Have you been able to return to your normal activities? Yes.    Do you have any questions about your discharge instructions: Diet   No. Medications  No. Follow up visit  No.  Do you have questions or concerns about your Care? No.  Actions: * If pain score is 4 or above: No action needed, pain <4.

## 2022-05-11 ENCOUNTER — Encounter: Payer: Self-pay | Admitting: Internal Medicine

## 2022-05-21 ENCOUNTER — Encounter: Payer: Self-pay | Admitting: *Deleted

## 2022-06-17 IMAGING — CT CT NECK W/ CM
4 series · 14 of 33 positions shown, 17 images · IV contrast (Omnipaque)
Comparison: None.

CLINICAL DATA: Soft tissue swelling, infection suspected

EXAM:
CT NECK WITH CONTRAST
TECHNIQUE: Multidetector CT imaging of the neck was performed using the
standard protocol following the bolus administration of intravenous
contrast.

[Series 3: axial neck · axial · 0.46mm/px · z∈[-252,-84]mm · 5 of 128 slices shown, 7 images]
[im 22/128  soft-tissue]
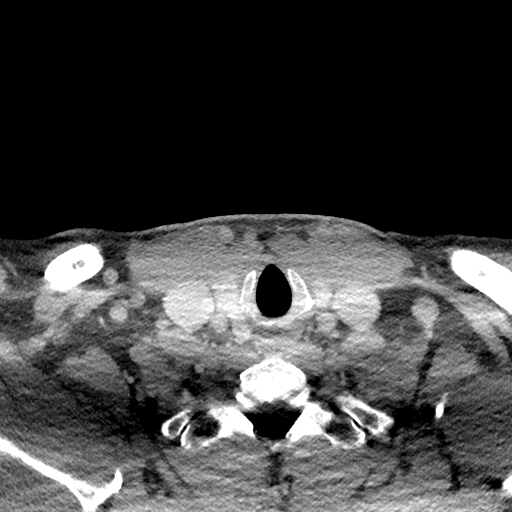
[im 22/128  bone]
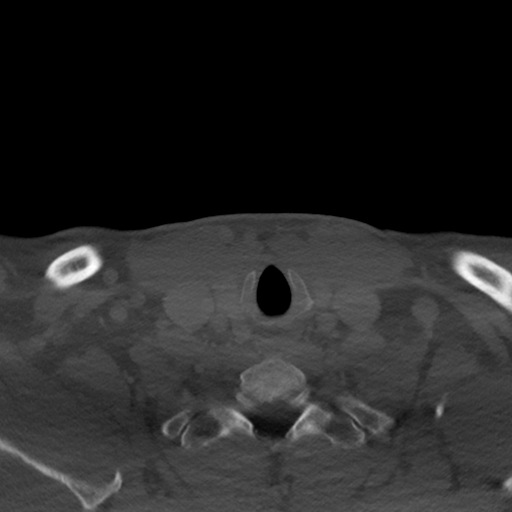
[im 43/128  bone]
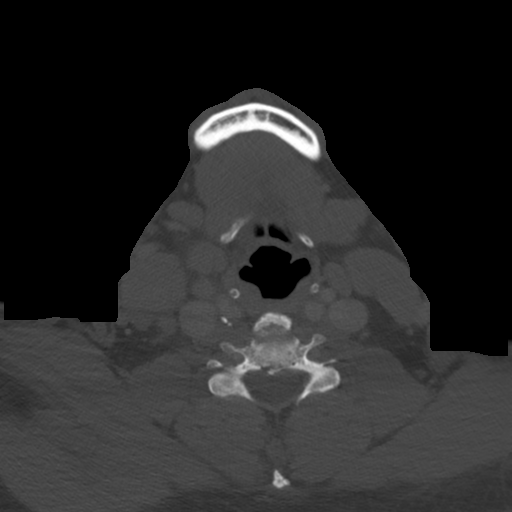
[im 64/128  bone]
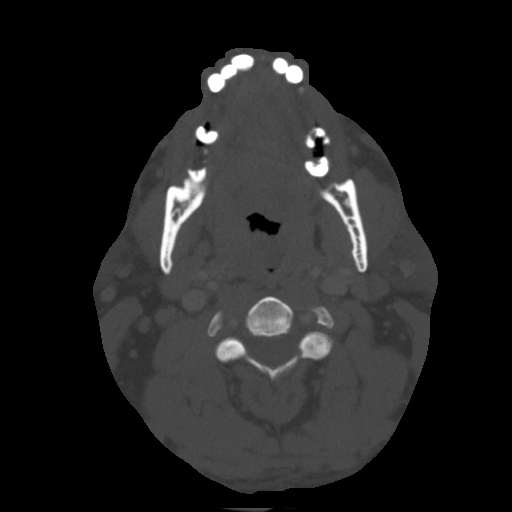
[im 85/128  bone]
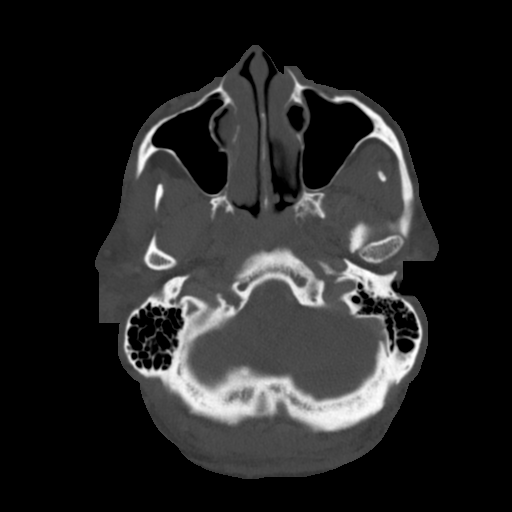
[im 106/128  soft-tissue]
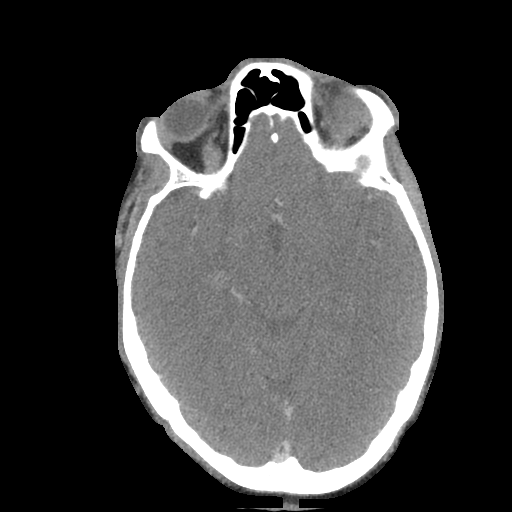
[im 106/128  bone]
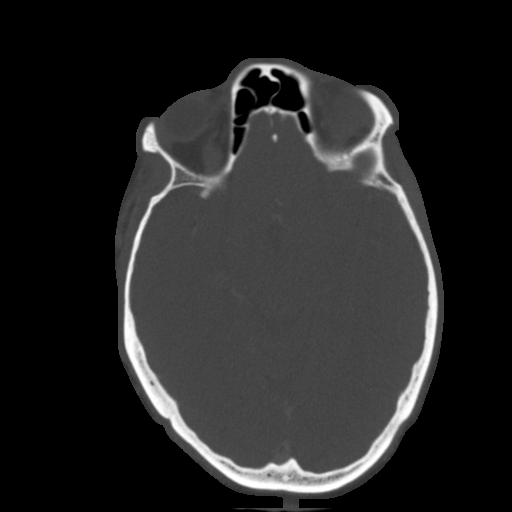

[Series 6: sag neck · sagittal · 0.50mm/px · 5 of 101 slices shown, 6 images]
[im 34/101  bone]
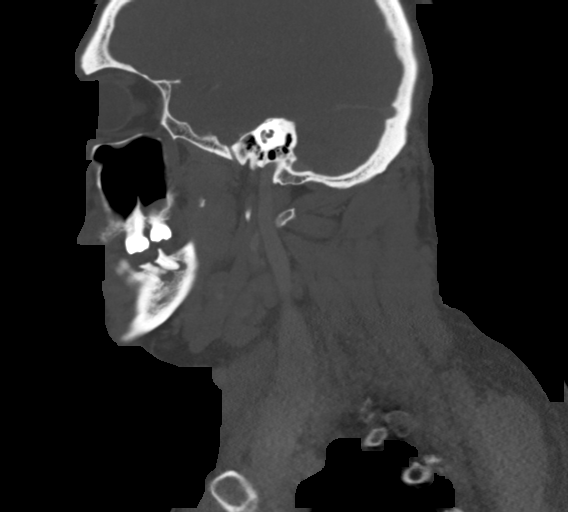
[im 42/101  bone]
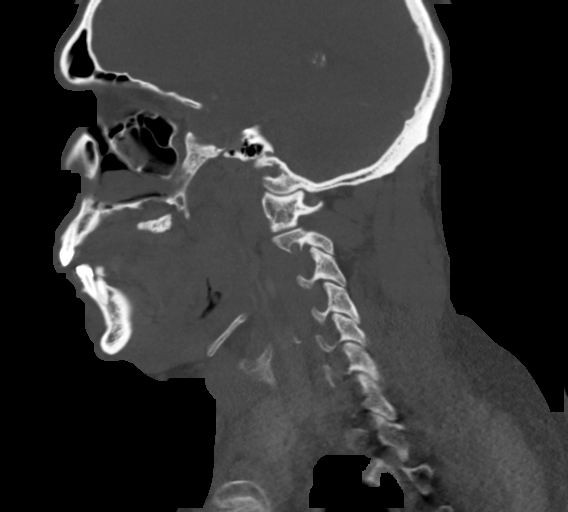
[im 51/101  soft-tissue]
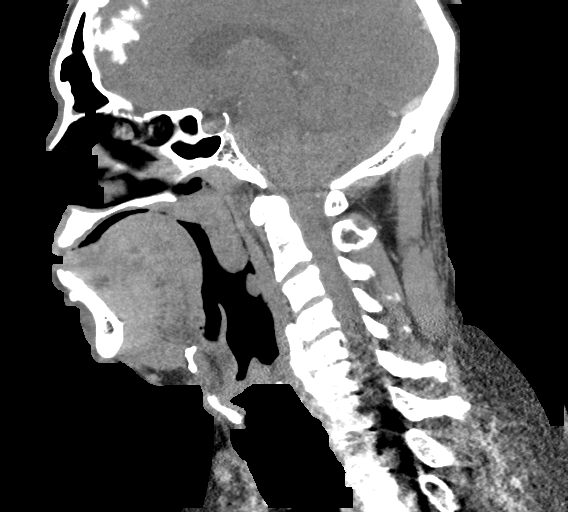
[im 51/101  bone]
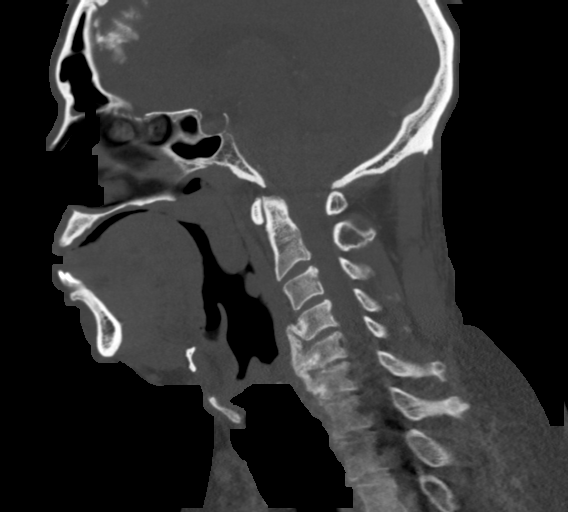
[im 59/101  bone]
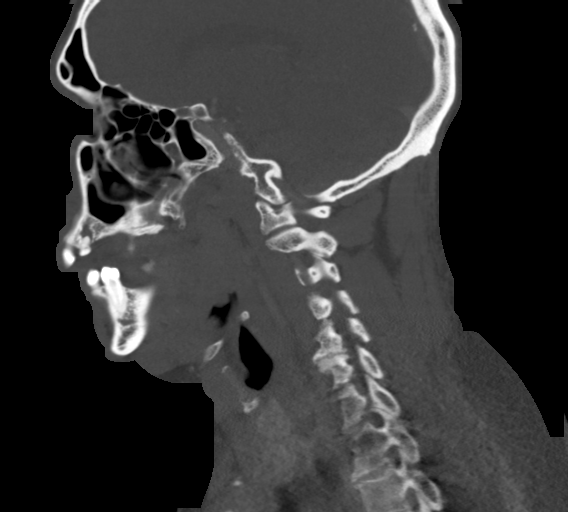
[im 67/101  bone]
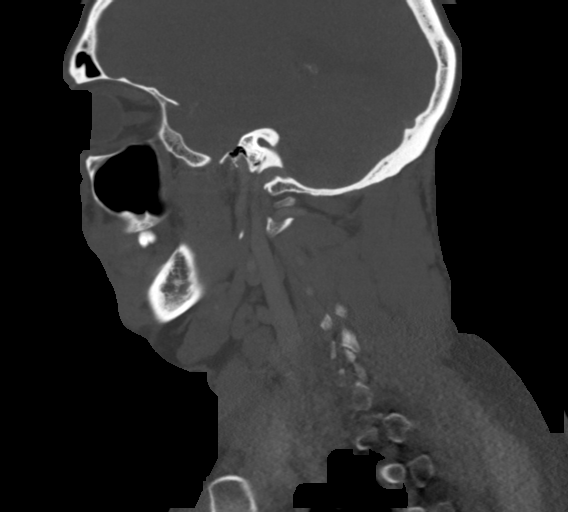

[Series 7: cor neck · coronal · 0.50mm/px · 3 of 115 slices shown]
[im 23/115  bone]
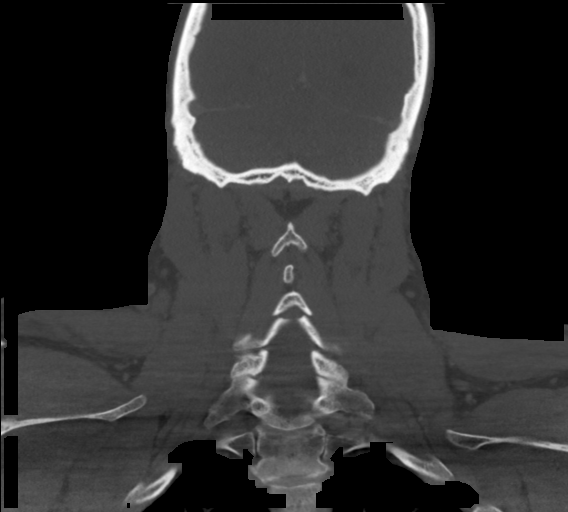
[im 46/115  bone]
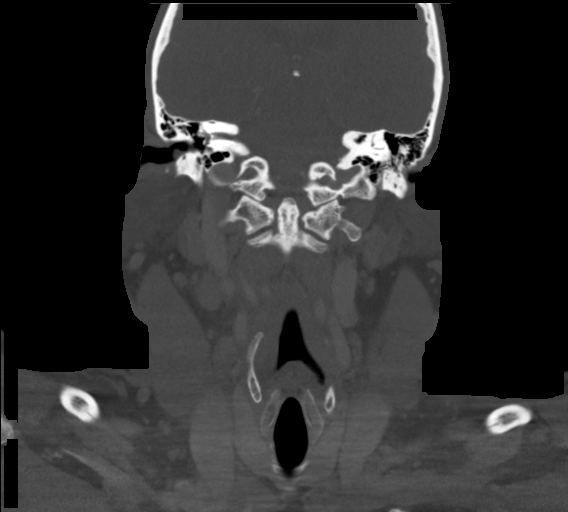
[im 69/115  bone]
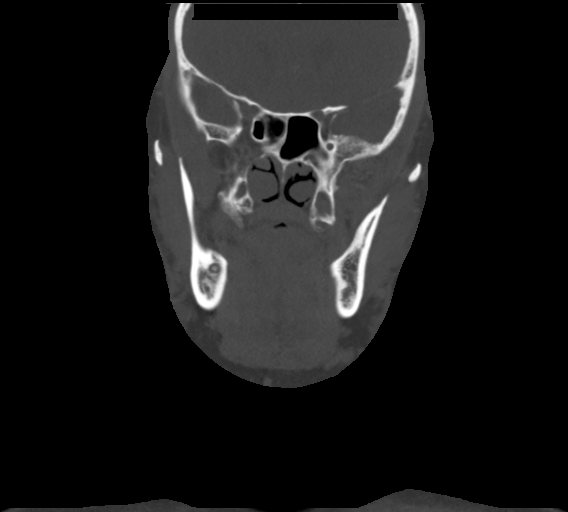

[Series 8: ax oropharynx · axial · 0.39mm/px · 1 of 129 slices shown]
[im 22/129  bone]
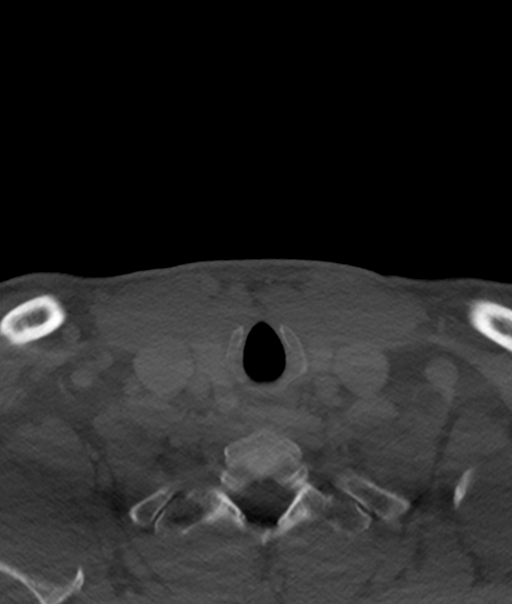

[14 of 33 positions shown; findings below may reference images not displayed]

RADIATION DOSE REDUCTION: This exam was performed according to the
departmental dose-optimization program which includes automated
exposure control, adjustment of the mA and/or kV according to
patient size and/or use of iterative reconstruction technique.

CONTRAST:  75mL OMNIPAQUE IOHEXOL 350 MG/ML SOLN
FINDINGS: Pharynx and larynx: Prominence of the adenoids. Prominence of the
palatine tonsils, greater on the right. There is mild heterogeneity
with some ill-defined areas of low density. Oropharyngeal wall
thickening probably reflects edema. There is also thickening of the
uvula. Epiglottis is normal. Larynx is unremarkable. There is
infiltration of the right parapharyngeal fat.

Salivary glands: Parotid and submandibular glands are unremarkable.

Thyroid: Normal.

Lymph nodes: Asymmetric top normal and mildly enlarged right level 2
lymph nodes are likely reactive.

Vascular: Major neck vessels are patent.

Limited intracranial: No abnormal enhancement. Filling defect within
the posterosuperior sagittal sinus favored to reflect an arachnoid
granulation.

Visualized orbits: Unremarkable.

Mastoids and visualized paranasal sinuses: No significant
opacification.

Skeleton: Bulky anterior bridging osteophytes at C4-C6. Multifocal
tooth decay. There are periapical lucencies about the posterior
right mandibular molar.

Upper chest: No apical lung mass.

Other: None.
IMPRESSION: Tonsillitis/pharyngitis. Likely some phlegmonous change in the right
palatine tonsil region extending to midline but no drainable
abscess.

## 2023-02-23 ENCOUNTER — Other Ambulatory Visit: Payer: Self-pay | Admitting: Medical

## 2023-03-31 ENCOUNTER — Other Ambulatory Visit: Payer: Self-pay | Admitting: Medical

## 2023-04-02 ENCOUNTER — Encounter (HOSPITAL_BASED_OUTPATIENT_CLINIC_OR_DEPARTMENT_OTHER): Payer: Self-pay | Admitting: Emergency Medicine

## 2023-04-02 ENCOUNTER — Other Ambulatory Visit: Payer: Self-pay

## 2023-04-02 ENCOUNTER — Emergency Department (HOSPITAL_BASED_OUTPATIENT_CLINIC_OR_DEPARTMENT_OTHER): Payer: Self-pay

## 2023-04-02 DIAGNOSIS — M25461 Effusion, right knee: Secondary | ICD-10-CM | POA: Diagnosis not present

## 2023-04-02 DIAGNOSIS — Z79899 Other long term (current) drug therapy: Secondary | ICD-10-CM | POA: Diagnosis not present

## 2023-04-02 DIAGNOSIS — M25561 Pain in right knee: Secondary | ICD-10-CM | POA: Diagnosis present

## 2023-04-02 NOTE — ED Triage Notes (Signed)
 Patient presents with right knee pain since this am. Reports hx "fluid in knee" and thinks pain related to same. Denies known injuries

## 2023-04-03 ENCOUNTER — Emergency Department (HOSPITAL_BASED_OUTPATIENT_CLINIC_OR_DEPARTMENT_OTHER)
Admission: EM | Admit: 2023-04-03 | Discharge: 2023-04-03 | Disposition: A | Discharge status: Home / Self Care | Payer: Self-pay | Attending: Emergency Medicine | Admitting: Emergency Medicine

## 2023-04-03 DIAGNOSIS — M25461 Effusion, right knee: Secondary | ICD-10-CM

## 2023-04-03 MED ORDER — DICLOFENAC SODIUM ER 100 MG PO TB24: 100.0000 mg | ORAL_TABLET | Freq: Every day | ORAL | 0 refills | Status: AC

## 2023-04-03 MED ORDER — LIDOCAINE 5 % EX PTCH: 1.0000 | MEDICATED_PATCH | CUTANEOUS | 0 refills | Status: AC

## 2023-04-03 MED ORDER — KETOROLAC TROMETHAMINE 60 MG/2ML IM SOLN
30.0000 mg | Freq: Once | INTRAMUSCULAR | Status: AC
  Administered 2023-04-03: 30 mg via INTRAMUSCULAR
  Filled 2023-04-03: qty 2

## 2023-04-03 MED ORDER — LIDOCAINE 5 % EX PTCH
1.0000 | MEDICATED_PATCH | CUTANEOUS | Status: DC
  Administered 2023-04-03: 1 via TRANSDERMAL
  Filled 2023-04-03: qty 1

## 2023-04-03 NOTE — ED Provider Notes (Signed)
 [facility] Provider Note   CSN: [id number] Arrival date & time: 04/02/23  1928     History  Chief Complaint  Patient presents with   Knee Pain    Malik Daniels is a 58 y.o. male.  The history is provided by the patient.  Knee Pain Location:  Knee Time since incident:  3 weeks Knee location:  R knee Pain details:    Quality:  Aching   Radiates to:  Does not radiate   Severity:  Moderate   Onset quality:  Gradual   Duration:  3 weeks   Timing:  Constant   Progression:  Worsening Relieved by:  Nothing Worsened by:  Nothing Ineffective treatments:  Acetaminophen Associated symptoms: swelling   Associated symptoms: no fever and no tingling   Risk factors: no concern for non-accidental trauma        Home Medications Prior to Admission medications   Medication Sig Start Date End Date Taking? Authorizing Provider  Diclofenac Sodium CR 100 MG 24 hr tablet Take 1 tablet (100 mg total) by mouth daily. 04/03/23  Yes Nikesh Teschner, MD  lidocaine (LIDODERM) 5 % Place 1 patch onto the skin daily. Remove & Discard patch within 12 hours or as directed by MD 04/03/23  Yes Lakasha Mcfall, MD  acetaminophen (TYLENOL) 500 MG tablet Take by mouth. 11/06/15   [provider]  amLODipine (NORVASC) 10 MG tablet TAKE 1 TABLET BY MOUTH EVERY DAY 02/25/23   Saguier, Ramon Dredge, PA-C  hydrochlorothiazide (HYDRODIURIL) 25 MG tablet TAKE 1 TABLET BY MOUTH EVERY DAY 04/01/23   Saguier, Ramon Dredge, PA-C  meloxicam (MOBIC) 15 MG tablet One tab PO qAM with breakfast for 2 weeks, then daily prn pain. 04/03/22   Myra Rude, MD      Allergies    Patient has no known allergies.    Review of Systems   Review of Systems  Constitutional:  Negative for fever.  HENT:  Negative for facial swelling.   Respiratory:  Negative for shortness of breath, wheezing and stridor.   Cardiovascular:  Negative for chest pain and leg swelling.  Musculoskeletal:   Positive for arthralgias.  All other systems reviewed and are negative.   Physical Exam Updated Vital Signs BP (!) 143/87 (BP Location: Right Arm)   Pulse 61   Temp (!) 97.4 F (36.3 C) (Oral)   Resp 20   SpO2 97%  Physical Exam Vitals and nursing note reviewed.  Constitutional:      General: He is not in acute distress.    Appearance: He is well-developed. He is not diaphoretic.  HENT:     Head: Normocephalic and atraumatic.  Eyes:     Conjunctiva/sclera: Conjunctivae normal.     Pupils: Pupils are equal, round, and reactive to light.  Cardiovascular:     Rate and Rhythm: Normal rate and regular rhythm.     Pulses: Normal pulses.     Heart sounds: Normal heart sounds.  Pulmonary:     Effort: Pulmonary effort is normal.     Breath sounds: Normal breath sounds. No wheezing or rales.  Abdominal:     General: Bowel sounds are normal.     Palpations: Abdomen is soft.     Tenderness: There is no abdominal tenderness. There is no guarding or rebound.  Musculoskeletal:     Cervical back: Normal range of motion and neck supple.     Right knee: Swelling and effusion present. No erythema, lacerations or  bony tenderness. No LCL laxity, MCL laxity, ACL laxity or PCL laxity. Normal alignment, normal meniscus and normal patellar mobility. Normal pulse.     Instability Tests: Anterior drawer test negative. Posterior drawer test negative. Medial McMurray test negative and lateral McMurray test negative.     Right lower leg: Normal.     Right ankle: Normal.     Right Achilles Tendon: Normal.     Right foot: Normal. Normal capillary refill.  Skin:    General: Skin is warm and dry.  Neurological:     Mental Status: He is alert and oriented to person, place, and time.     ED Results / Procedures / Treatments   Labs (all labs ordered are listed, but only abnormal results are displayed) Labs Reviewed - No data to display  EKG None  Radiology DG Knee Complete 4 Views Right Result  Date: 04/02/2023 CLINICAL DATA:  Knee pain.  No history of trauma EXAM: RIGHT KNEE - COMPLETE 4 VIEW COMPARISON:  None Available. FINDINGS: Large joint effusion on lateral view. No fracture or dislocation. Mild joint space loss of the medial compartment. Small osteophytes seen of all 3 compartments. Hyperostosis. Preserved bone mineralization. IMPRESSION: Intractable degenerative changes. Large joint effusion. There is a differential of a joint effusion. Please correlate with clinical presentation including for any evidence of infection or trauma. Electronically Signed   By: Karen Kays M.D.   On: 04/02/2023 20:26    Procedures Procedures    Medications Ordered in ED Medications  lidocaine (LIDODERM) 5 % 1 patch (1 patch Transdermal Patch Applied 04/03/23 0303)  ketorolac (TORADOL) injection 30 mg (30 mg Intramuscular Given 04/03/23 0303)    ED Course/ Medical Decision Making/ A&P                                 Medical Decision Making Patient with 3 weeks of right knee swelling and pain.  Does not recall trauma.    Amount and/or Complexity of Data Reviewed External Data Reviewed: notes.    Details: Previous notes reviewed  Radiology: ordered and independent interpretation performed.    Details: No fracture   Risk Prescription drug management. Risk Details: Differential included: trauma with bloody effusion, gout, arthropathy.  No fevers I do not believe this is infectious.  Placed in immobilizer.  Instructed to use lace up shoes.  RX for NSAIDs and follow up with orthopedics.  Stable for discharge.      Final Clinical Impression(s) / ED Diagnoses Final diagnoses:  Effusion of right knee   Return for intractable cough, coughing up blood, fevers > 100.4  I have reviewed the triage vital signs and the nursing notes. Pertinent labs & imaging results that were available during my care of the patient were reviewed by me and considered in my medical decision making (see chart for  details). After history, exam, and medical workup I feel the patient has been appropriately medically screened and is safe for discharge home. Pertinent diagnoses were discussed with the patient. Patient was given return precautions.  Rx / DC Orders ED Discharge Orders          Ordered    Diclofenac Sodium CR 100 MG 24 hr tablet  Daily        04/03/23 0246    lidocaine (LIDODERM) 5 %  Every 24 hours        04/03/23 0246  Jiovany Scheffel, MD 04/03/23 [phone]

## 2023-04-10 ENCOUNTER — Encounter: Payer: Self-pay | Admitting: Medical

## 2023-04-10 ENCOUNTER — Ambulatory Visit: Payer: No Typology Code available for payment source | Admitting: Medical

## 2023-04-10 VITALS — BP 135/75 | HR 69 | Temp 98.0°F | Resp 16 | Ht 72.0 in | Wt 273.6 lb

## 2023-04-10 DIAGNOSIS — M25561 Pain in right knee: Secondary | ICD-10-CM | POA: Diagnosis not present

## 2023-04-10 DIAGNOSIS — Z0000 Encounter for general adult medical examination without abnormal findings: Secondary | ICD-10-CM | POA: Diagnosis not present

## 2023-04-10 DIAGNOSIS — Z23 Encounter for immunization: Secondary | ICD-10-CM

## 2023-04-10 DIAGNOSIS — Z113 Encounter for screening for infections with a predominantly sexual mode of transmission: Secondary | ICD-10-CM | POA: Diagnosis not present

## 2023-04-10 DIAGNOSIS — Z125 Encounter for screening for malignant neoplasm of prostate: Secondary | ICD-10-CM | POA: Diagnosis not present

## 2023-04-10 LAB — LIPID PANEL
Cholesterol: 160 mg/dL (ref 0–200)
HDL: 50.3 mg/dL (ref >39.00)
LDL Cholesterol: 93 mg/dL (ref 0–99)
NonHDL: 109.52
Total CHOL/HDL Ratio: 3
Triglycerides: 84 mg/dL (ref 0.0–149.0)
VLDL: 16.8 mg/dL (ref 0.0–40.0)

## 2023-04-10 LAB — CBC WITH DIFFERENTIAL/PLATELET
Basophils Absolute: 0.1 10*3/uL (ref 0.0–0.1)
Basophils Relative: 0.7 % (ref 0.0–3.0)
Eosinophils Absolute: 0.2 10*3/uL (ref 0.0–0.7)
Eosinophils Relative: 2.2 % (ref 0.0–5.0)
HCT: 44.1 % (ref 39.0–52.0)
Hemoglobin: 14.6 g/dL (ref 13.0–17.0)
Lymphocytes Relative: 32.3 % (ref 12.0–46.0)
Lymphs Abs: 2.2 10*3/uL (ref 0.7–4.0)
MCHC: 33.1 g/dL (ref 30.0–36.0)
MCV: 96.9 fl (ref 78.0–100.0)
Monocytes Absolute: 0.7 10*3/uL (ref 0.1–1.0)
Monocytes Relative: 10.6 % (ref 3.0–12.0)
Neutro Abs: 3.7 10*3/uL (ref 1.4–7.7)
Neutrophils Relative %: 54.2 % (ref 43.0–77.0)
Platelets: 289 10*3/uL (ref 150.0–400.0)
RBC: 4.55 Mil/uL (ref 4.22–5.81)
RDW: 13.8 % (ref 11.5–15.5)
WBC: 6.8 10*3/uL (ref 4.0–10.5)

## 2023-04-10 LAB — COMPREHENSIVE METABOLIC PANEL
ALT: 18 U/L (ref 0–53)
AST: 21 U/L (ref 0–37)
Albumin: 4.6 g/dL (ref 3.5–5.2)
Alkaline Phosphatase: 55 U/L (ref 39–117)
BUN: 14 mg/dL (ref 6–23)
CO2: 30 meq/L (ref 19–32)
Calcium: 9.8 mg/dL (ref 8.4–10.5)
Chloride: 101 meq/L (ref 96–112)
Creatinine, Ser: 0.92 mg/dL (ref 0.40–1.50)
GFR: 92.33 mL/min (ref >60.00)
Glucose, Bld: 79 mg/dL (ref 70–99)
Potassium: 3.9 meq/L (ref 3.5–5.1)
Sodium: 137 meq/L (ref 135–145)
Total Bilirubin: 0.4 mg/dL (ref 0.2–1.2)
Total Protein: 7.8 g/dL (ref 6.0–8.3)

## 2023-04-10 LAB — PSA: PSA: 20.13 ng/mL — ABNORMAL HIGH (ref 0.10–4.00)

## 2023-04-10 NOTE — Patient Instructions (Addendum)
 For you wellness exam today I have ordered cbc, lipid panel cmp, psa and hiv.  Vaccine given today flu vaccine and shingrix  Recommend exercise and healthy diet.  We will let you know lab results as they come in.  Follow up date appointment will be determined after lab review.     Knee pain with effusion Recurrent knee effusion with decreased range of motion and pain. X-ray showed degenerative changes and large effusion with osteophytes in three compartments. Improved but not resolved with Toradol injection and Diclofenac. -Continue Diclofenac for the next 5 days. -Check metabolic panel and kidney function due to Diclofenac use. -Refer to Orthopedics for expedited appointment within a week, while keeping the existing appointment on April 8th as a backup. -Continue using knee immobilizer.

## 2023-04-10 NOTE — Addendum Note (Signed)
 Addended by: Marian Sorrow D on: 04/10/2023 10:46 AM   Modules accepted: Orders

## 2023-04-10 NOTE — Progress Notes (Signed)
 Subjective:    Patient ID: Malik Daniels, male    DOB: 1965-05-04, 58 y.o.   MRN: [id number]  HPI  Wellness exam today. Came in for knee pain and also wanted wellness exam.   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 Not much exercise at all. Moderate healthy diet. But sometimes too much fried foods and to much sugar. Non smoker. No alcohol.    Flu vaccine- today. Will get second shingrix today.  Up to date on colonosocpy.   Below follow up from ED.  He has been experiencing recurrent knee swelling and pain for approximately four to five weeks, with the swelling significant enough to prevent bending or walking on the knee. Initially, he managed the swelling with rest, elevation, and ice, which led to some reduction in swelling.  Approximately three weeks after the initial swelling, he experienced another episode, prompting a visit to the emergency room. During this visit, he received a Toradol injection, a knee immobilizer, and was prescribed diclofenac tablets for inflammation. An x-ray revealed degenerative changes, a large effusion, and osteophytes in three compartments of the knee.  Since starting diclofenac, he reports improved range of motion, although the knee remains painful and swollen. He has been using the knee brace and taking diclofenac daily, with five days of medication remaining from a ten-day supply. He is scheduled to see an orthopedist in about 3 weeks but is seeking an earlier appointment due to ongoing symptoms.   Review of Systems  Constitutional:  Negative for chills, fatigue and fever.  HENT:  Negative for congestion and ear discharge.   Respiratory:  Negative for cough, chest tightness, shortness of breath and wheezing.   Cardiovascular:  Negative for chest pain and palpitations.  Gastrointestinal:  Negative for abdominal pain, blood in stool, diarrhea and nausea.  Genitourinary:  Negative for difficulty urinating, flank pain, frequency, penile pain and testicular pain.   Musculoskeletal:  Negative for back pain, myalgias, neck pain and neck stiffness.       Rt knee pain  Skin:  Negative for rash.    Past Medical History:  Diagnosis Date   Hypertension    Prostate cancer [facility])      Social History   Socioeconomic History   Marital status: Married    Spouse name: Not on file   Number of children: Not on file   Years of education: Not on file   Highest education level: Not on file  Occupational History   Not on file  Tobacco Use   Smoking status: Never   Smokeless tobacco: Never  Substance and Sexual Activity   Alcohol use: No   Drug use: No   Sexual activity: Not on file  Other Topics Concern   Not on file  Social History Narrative   Not on file   Social Drivers of Health   Financial Resource Strain: Not on file  Food Insecurity: Not on file  Transportation Needs: Not on file  Physical Activity: Not on file  Stress: Not on file  Social Connections: Not on file  Intimate Partner Violence: Not on file    Past Surgical History:  Procedure Laterality Date   NONE      Family History  Problem Relation Age of Onset   Aneurysm Mother    Hypertension Mother    Uterine cancer Sister    Breast cancer Sister    Heart Problems Sister        Takes Coumadin   Breast cancer Sister  Prostate cancer Brother    Congestive Heart Failure Brother    Congestive Heart Failure Brother    Diabetes Brother    Kidney disease Brother    Colon cancer Neg Hx    Colon polyps Neg Hx    Esophageal cancer Neg Hx    Rectal cancer Neg Hx    Stomach cancer Neg Hx     No Known Allergies  Current Outpatient Medications on File Prior to Visit  Medication Sig Dispense Refill   acetaminophen (TYLENOL) 500 MG tablet Take by mouth.     amLODipine (NORVASC) 10 MG tablet TAKE 1 TABLET BY MOUTH EVERY DAY 90 tablet 2   Diclofenac Sodium CR 100 MG 24 hr tablet Take 1 tablet (100 mg total) by mouth daily. 10 tablet 0   hydrochlorothiazide (HYDRODIURIL) 25  MG tablet TAKE 1 TABLET BY MOUTH EVERY DAY 30 tablet 10   lidocaine (LIDODERM) 5 % Place 1 patch onto the skin daily. Remove & Discard patch within 12 hours or as directed by MD 30 patch 0   meloxicam (MOBIC) 15 MG tablet One tab PO qAM with breakfast for 2 weeks, then daily prn pain. 45 tablet 1   No current facility-administered medications on file prior to visit.    BP 135/75 (BP Location: Left Arm, Patient Position: Sitting, Cuff Size: Large)   Pulse 69   Temp 98 F (36.7 C) (Oral)   Resp 16   Ht 6' (1.829 m)   Wt 273 lb 9.6 oz (124.1 kg)   SpO2 (!) 10%   BMI 37.11 kg/m        Objective:   Physical Exam  General Mental Status- Alert. General Appearance- Not in acute distress.    Skin General: Color- Normal Color. Moisture- Normal Moisture.   Neck Carotid Arteries- Normal color. Moisture- Normal Moisture. No carotid bruits. No JVD.   Chest and Lung Exam Auscultation: Breath Sounds:-Normal.   Cardiovascular Auscultation:Rythm- Regular. Murmurs & Other Heart Sounds:Auscultation of the heart reveals- No Murmurs.   Abdomen Inspection:-Inspeection Normal. Palpation/Percussion:Note:No mass. Palpation and Percussion of the abdomen reveal- Non Tender, Non Distended + BS, no rebound or guarding.     Neurologic Cranial Nerve exam:- CN III-XII intact(No nystagmus), symmetric smile. Strength:- 5/5 equal and symmetric strength both upper and lower extremities.   Rt knee- moderate swollen. Moderate decreased range of motion. No severe tenderness to palpation. Calf not swollen.    Assessment & Plan:  For you wellness exam today I have ordered cbc, lipid panel cmp, psa and hiv.  Vaccine given today flu vaccine and shingrix  Recommend exercise and healthy diet.  We will let you know lab results as they come in.  Follow up date appointment will be determined after lab review.     Knee pain with effusion Recurrent knee effusion with decreased range of motion and pain.  X-ray showed degenerative changes and large effusion with osteophytes in three compartments. Improved but not resolved with Toradol injection and Diclofenac. -Continue Diclofenac for the next 5 days. -Check metabolic panel and kidney function due to Diclofenac use. -Refer to Orthopedics for expedited appointment within a week, while keeping the existing appointment on April 8th as a backup. -Continue using knee immobilizer.  Esperanza Richters, [place]    [id number] charge for his knee pain with effusion for which originally scheduled.

## 2023-04-11 LAB — HIV ANTIBODY (ROUTINE TESTING W REFLEX): HIV 1&2 Ab, 4th Generation: NONREACTIVE

## 2023-04-16 ENCOUNTER — Encounter: Payer: Self-pay | Admitting: Medical

## 2023-05-13 NOTE — Progress Notes (Incomplete)
 05/14/2023 12:27 PM   Malik Daniels 02-28-1965 [id number]  Referring provider: Esperanza Richters, PA-C [id number] [place],  [place] [address]  No chief complaint on file.   HPI:  10.14.2021: Prostate MRI for elevating PSA trend. Volume 72 mL, no evidence of transcapsular invasion, NVB, SV involvement, pelvic lymphadenopathy or bony metastatic dz. No suspicious prostatic lesions.   12.10.2021: TRUS/Bx. PSA 14.4, prostate volume 58.3 mL, PSAD 0.25. 1/12 cores revealed GS 3+3 pattern, positive core was right mid medial. 10% of core involved.  Following consultation , the patient sided on active surveillance.   8.24.2022: no real urinary issues over the past few months. Last visit here was in February, 2022. IPSS 8, quality of life score 2. PSA 13.4, lower than pre biopsy.   PMH: Past Medical History:  Diagnosis Date   Hypertension    Prostate cancer [facility])     Surgical History: Past Surgical History:  Procedure Laterality Date   NONE      Home Medications:  Allergies as of 05/14/2023   No Known Allergies      Medication List        Accurate as of May 13, 2023 12:27 PM. If you have any questions, ask your nurse or doctor.          acetaminophen 500 MG tablet Commonly known as: TYLENOL Take by mouth.   amLODipine 10 MG tablet Commonly known as: NORVASC TAKE 1 TABLET BY MOUTH EVERY DAY   Diclofenac Sodium CR 100 MG 24 hr tablet Take 1 tablet (100 mg total) by mouth daily.   hydrochlorothiazide 25 MG tablet Commonly known as: HYDRODIURIL TAKE 1 TABLET BY MOUTH EVERY DAY   lidocaine 5 % Commonly known as: Lidoderm Place 1 patch onto the skin daily. Remove & Discard patch within 12 hours or as directed by MD   meloxicam 15 MG tablet Commonly known as: MOBIC One tab PO qAM with breakfast for 2 weeks, then daily prn pain.        Allergies: No Known Allergies  Family History: Family History  Problem Relation Age of Onset   Aneurysm  Mother    Hypertension Mother    Uterine cancer Sister    Breast cancer Sister    Heart Problems Sister        Takes Coumadin   Breast cancer Sister    Prostate cancer Brother    Congestive Heart Failure Brother    Congestive Heart Failure Brother    Diabetes Brother    Kidney disease Brother    Colon cancer Neg Hx    Colon polyps Neg Hx    Esophageal cancer Neg Hx    Rectal cancer Neg Hx    Stomach cancer Neg Hx     Social History:  reports that he has never smoked. He has never used smokeless tobacco. He reports that he does not drink alcohol and does not use drugs.  ROS: All other review of systems were reviewed and are negative except what is noted above in HPI  Physical Exam: There were no vitals taken for this visit.  Constitutional:  Alert and oriented, No acute distress. HEENT: [place] AT, moist mucus membranes.  Trachea midline, no masses. Cardiovascular: No clubbing, cyanosis, or edema. Respiratory: Normal respiratory effort, no increased work of breathing. GI: No inguinal hernias GU: Normal phallus. No masses/lesions on penis, testis, scrotum. Prostate ***g smooth no nodules no induration.  Lymph: No cervical or inguinal lymphadenopathy. Skin: No rashes,  bruises or suspicious lesions. Neurologic: Grossly intact, no focal deficits, moving all 4 extremities. Psychiatric: Normal mood and affect.  Laboratory Data: Lab Results  Component Value Date   WBC 6.8 04/10/2023   HGB 14.6 04/10/2023   HCT 44.1 04/10/2023   MCV 96.9 04/10/2023   PLT 289.0 04/10/2023    Lab Results  Component Value Date   CREATININE 0.92 04/10/2023    Lab Results  Component Value Date   PSA 20.13 (H) 04/10/2023   PSA 13.48 (H) 05/19/2019    No results found for: "TESTOSTERONE"  Lab Results  Component Value Date   HGBA1C 5.6 09/07/2020    Urinalysis    Component Value Date/Time   BILIRUBINUR negative 05/19/2019 0951   PROTEINUR Positive (A) 05/19/2019 0951   UROBILINOGEN 0.2  05/19/2019 0951   NITRITE negative 05/19/2019 0951   LEUKOCYTESUR Negative 05/19/2019 0951    No results found for: "LABMICR", "WBCUA", "RBCUA", "LABEPIT", "MUCUS", "BACTERIA"  Pertinent Imaging: *** Results for orders placed during the hospital encounter of 05/09/04  DG Abd 1 View  Narrative Clinical Data: Hematuria for two days. KUB - 05/09/04: Comparison: None. Findings: Two calcifications with central lucencies are seen in the right hemipelvis and are compatible with phleboliths. No unexpected calcifications to suggest urinary tract stones are identified over the renal shadows, expected course of the ureters, or the urinary bladder. The bowel gas patter is unremarkable.  Impression Negative KUB.   Provider: Milas Hock  No results found for this or any previous visit.  No results found for this or any previous visit.  No results found for this or any previous visit.  No results found for this or any previous visit.  No results found for this or any previous visit.  No results found for this or any previous visit.  No results found for this or any previous visit.   Assessment & Plan:    There are no diagnoses linked to this encounter.  No follow-ups on file.  Chelsea Aus, MD  [facility] [place]

## 2023-05-14 ENCOUNTER — Ambulatory Visit: Payer: No Typology Code available for payment source | Admitting: Urology

## 2023-05-14 DIAGNOSIS — C61 Malignant neoplasm of prostate: Secondary | ICD-10-CM

## 2024-02-15 ENCOUNTER — Other Ambulatory Visit: Payer: Self-pay | Admitting: Medical

## 2024-02-18 ENCOUNTER — Other Ambulatory Visit: Payer: Self-pay | Admitting: Medical
